# Patient Record
Sex: Male | Born: 1940 | Race: Black or African American | Hispanic: No | Marital: Single | Smoking: Current some day smoker
Health system: Southern US, Community
[De-identification: ages and names within clinical notes are randomized; demographics above are authoritative.]

## PROBLEM LIST (undated history)

## (undated) ENCOUNTER — Emergency Department (HOSPITAL_COMMUNITY): Admission: EM | Payer: Medicare Other

## (undated) DIAGNOSIS — Z720 Tobacco use: Secondary | ICD-10-CM

## (undated) DIAGNOSIS — H9190 Unspecified hearing loss, unspecified ear: Secondary | ICD-10-CM

## (undated) DIAGNOSIS — F1010 Alcohol abuse, uncomplicated: Secondary | ICD-10-CM

## (undated) HISTORY — PX: ABDOMINAL SURGERY: SHX537

---

## 1998-11-09 ENCOUNTER — Inpatient Hospital Stay (HOSPITAL_COMMUNITY): Admission: EM | Admit: 1998-11-09 | Discharge: 1998-11-15 | Payer: Self-pay | Admitting: Emergency Medicine

## 1998-11-09 ENCOUNTER — Encounter (INDEPENDENT_AMBULATORY_CARE_PROVIDER_SITE_OTHER): Payer: Self-pay

## 1998-11-09 ENCOUNTER — Encounter: Payer: Self-pay | Admitting: General Surgery

## 2000-01-30 ENCOUNTER — Emergency Department (HOSPITAL_COMMUNITY): Admission: EM | Admit: 2000-01-30 | Discharge: 2000-01-30 | Payer: Self-pay | Admitting: *Deleted

## 2004-07-28 ENCOUNTER — Emergency Department (HOSPITAL_COMMUNITY): Admission: EM | Admit: 2004-07-28 | Discharge: 2004-07-28 | Payer: Self-pay | Admitting: Emergency Medicine

## 2008-02-11 ENCOUNTER — Inpatient Hospital Stay (HOSPITAL_COMMUNITY): Admission: EM | Admit: 2008-02-11 | Discharge: 2008-02-12 | Payer: Self-pay | Admitting: Family Medicine

## 2009-01-26 ENCOUNTER — Emergency Department (HOSPITAL_COMMUNITY): Admission: EM | Admit: 2009-01-26 | Discharge: 2009-01-26 | Payer: Self-pay | Admitting: Emergency Medicine
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
## 2010-11-26 LAB — COMPREHENSIVE METABOLIC PANEL
ALT: 12 U/L (ref 0–53)
Alkaline Phosphatase: 67 U/L (ref 39–117)
BUN: 12 mg/dL (ref 6–23)
CO2: 24 mEq/L (ref 19–32)
GFR calc non Af Amer: >60 mL/min (ref >60)
Glucose, Bld: 72 mg/dL (ref 70–99)
Potassium: 4.1 mEq/L (ref 3.5–5.1)
Sodium: 136 mEq/L (ref 135–145)
Total Bilirubin: 1 mg/dL (ref 0.3–1.2)

## 2010-11-26 LAB — DIFFERENTIAL
Basophils Absolute: 0 10*3/uL (ref 0.0–0.1)
Basophils Relative: 0 % (ref 0–1)
Eosinophils Absolute: 0.1 10*3/uL (ref 0.0–0.7)
Neutrophils Relative %: 73 % (ref 43–77)

## 2010-11-26 LAB — URINALYSIS, ROUTINE W REFLEX MICROSCOPIC
Bilirubin Urine: NEGATIVE
Glucose, UA: NEGATIVE mg/dL
Ketones, ur: NEGATIVE mg/dL
Nitrite: NEGATIVE
Protein, ur: NEGATIVE mg/dL
pH: 5.5 (ref 5.0–8.0)

## 2010-11-26 LAB — BASIC METABOLIC PANEL
CO2: 28 mEq/L (ref 19–32)
Calcium: 9.1 mg/dL (ref 8.4–10.5)
Creatinine, Ser: 1.09 mg/dL (ref 0.4–1.5)
GFR calc Af Amer: >60 mL/min (ref >60)
GFR calc non Af Amer: >60 mL/min (ref >60)

## 2010-11-26 LAB — LIPASE, BLOOD: Lipase: 18 U/L (ref 11–59)

## 2010-11-26 LAB — URINE MICROSCOPIC-ADD ON

## 2010-11-26 LAB — CBC
HCT: 45.6 % (ref 39.0–52.0)
Hemoglobin: 14.7 g/dL (ref 13.0–17.0)
MCHC: 32 g/dL (ref 30.0–36.0)
RBC: 4.66 MIL/uL (ref 4.22–5.81)
RBC: 5.3 MIL/uL (ref 4.22–5.81)

## 2011-04-19 ENCOUNTER — Ambulatory Visit (INDEPENDENT_AMBULATORY_CARE_PROVIDER_SITE_OTHER): Payer: Medicare Other | Admitting: Family Medicine

## 2011-04-19 VITALS — BP 135/83 | HR 64 | Temp 97.4°F | Resp 18 | Ht 68.5 in | Wt 137.0 lb

## 2011-04-19 DIAGNOSIS — Z125 Encounter for screening for malignant neoplasm of prostate: Secondary | ICD-10-CM | POA: Diagnosis not present

## 2011-04-19 DIAGNOSIS — H9319 Tinnitus, unspecified ear: Secondary | ICD-10-CM | POA: Diagnosis not present

## 2011-04-19 DIAGNOSIS — Z0000 Encounter for general adult medical examination without abnormal findings: Secondary | ICD-10-CM

## 2011-04-19 DIAGNOSIS — N400 Enlarged prostate without lower urinary tract symptoms: Secondary | ICD-10-CM

## 2011-04-19 LAB — COMPREHENSIVE METABOLIC PANEL
ALT: 17 U/L (ref 0–53)
CO2: 21 mEq/L (ref 19–32)
Calcium: 10.3 mg/dL (ref 8.4–10.5)
Chloride: 104 mEq/L (ref 96–112)
Creat: 0.98 mg/dL (ref 0.50–1.35)
Sodium: 139 mEq/L (ref 135–145)
Total Protein: 8.5 g/dL — ABNORMAL HIGH (ref 6.0–8.3)

## 2011-04-19 NOTE — Progress Notes (Signed)
  Subjective:    Patient ID: Corey Dorsey, male    DOB: August 17, 1940, 71 y.o.   MRN: [id number]  HPI 71 yo male here for cpe.  Also complains of tinnitus for about a month or less.  Comes and goes.  Sometimes very loud.  Very irritating to patient.  No other symptoms of congestion, cough, sore throat.  No other complaints.  Generally healthy.  On no meds.    Review of Systems Negative except as per HPI     Objective:   Physical Exam  Constitutional: Vital signs are normal. He appears well-developed and well-nourished. He is active.  HENT:  Head: Normocephalic.  Right Ear: Hearing, tympanic membrane, external ear and ear canal normal.  Left Ear: Hearing, tympanic membrane, external ear and ear canal normal.  Nose: Nose normal.  Mouth/Throat: Uvula is midline, oropharynx is clear and moist and mucous membranes are normal.  Eyes: Conjunctivae and EOM are normal. Pupils are equal, round, and reactive to light.  Neck: No mass and no thyromegaly present.  Cardiovascular: Normal rate, regular rhythm, normal heart sounds, intact distal pulses and normal pulses.   Pulmonary/Chest: Effort normal and breath sounds normal.  Abdominal: Soft. Normal appearance and bowel sounds are normal. There is no hepatosplenomegaly. There is no tenderness. There is no CVA tenderness. No hernia.  Genitourinary: Testes normal and penis normal.  Lymphadenopathy:    He has no cervical adenopathy.    He has no axillary adenopathy.  Neurological: He is alert.          Assessment & Plan:  cpe - routine labs Tinnitus - flonase and pred taper.

## 2011-04-20 LAB — CBC
MCV: 88.8 fL (ref 78.0–100.0)
Platelets: 135 10*3/uL — ABNORMAL LOW (ref 150–400)
RBC: 5.27 MIL/uL (ref 4.22–5.81)
WBC: 4.5 10*3/uL (ref 4.0–10.5)

## 2011-04-20 LAB — PSA, MEDICARE: PSA: 0.62 ng/mL (ref <=4.00)

## 2011-04-28 ENCOUNTER — Other Ambulatory Visit: Payer: Self-pay | Admitting: Internal Medicine

## 2011-04-28 DIAGNOSIS — Z131 Encounter for screening for diabetes mellitus: Secondary | ICD-10-CM | POA: Diagnosis not present

## 2011-04-28 DIAGNOSIS — M899 Disorder of bone, unspecified: Secondary | ICD-10-CM | POA: Diagnosis not present

## 2011-04-28 DIAGNOSIS — M1710 Unilateral primary osteoarthritis, unspecified knee: Secondary | ICD-10-CM | POA: Diagnosis not present

## 2011-04-28 DIAGNOSIS — F23 Brief psychotic disorder: Secondary | ICD-10-CM | POA: Diagnosis not present

## 2011-04-28 DIAGNOSIS — Z23 Encounter for immunization: Secondary | ICD-10-CM | POA: Diagnosis not present

## 2011-04-28 DIAGNOSIS — N400 Enlarged prostate without lower urinary tract symptoms: Secondary | ICD-10-CM | POA: Diagnosis not present

## 2011-04-28 DIAGNOSIS — M19019 Primary osteoarthritis, unspecified shoulder: Secondary | ICD-10-CM | POA: Insufficient documentation

## 2011-04-28 DIAGNOSIS — M25519 Pain in unspecified shoulder: Secondary | ICD-10-CM | POA: Diagnosis not present

## 2011-04-28 DIAGNOSIS — Z1329 Encounter for screening for other suspected endocrine disorder: Secondary | ICD-10-CM | POA: Diagnosis not present

## 2011-04-28 DIAGNOSIS — M259 Joint disorder, unspecified: Secondary | ICD-10-CM | POA: Insufficient documentation

## 2011-04-28 DIAGNOSIS — Z13220 Encounter for screening for lipoid disorders: Secondary | ICD-10-CM | POA: Diagnosis not present

## 2011-04-28 DIAGNOSIS — K5900 Constipation, unspecified: Secondary | ICD-10-CM | POA: Diagnosis not present

## 2011-04-29 ENCOUNTER — Emergency Department (HOSPITAL_COMMUNITY)
Admission: EM | Admit: 2011-04-29 | Discharge: 2011-04-29 | Disposition: A | Discharge status: Home Health | Payer: Medicare Other | Attending: Emergency Medicine | Admitting: Emergency Medicine

## 2011-04-29 ENCOUNTER — Encounter (HOSPITAL_COMMUNITY): Payer: Self-pay | Admitting: Emergency Medicine

## 2011-04-29 ENCOUNTER — Emergency Department (HOSPITAL_COMMUNITY): Payer: Medicare Other

## 2011-04-29 DIAGNOSIS — M25519 Pain in unspecified shoulder: Secondary | ICD-10-CM | POA: Diagnosis not present

## 2011-04-29 DIAGNOSIS — M19012 Primary osteoarthritis, left shoulder: Secondary | ICD-10-CM

## 2011-04-29 DIAGNOSIS — M259 Joint disorder, unspecified: Secondary | ICD-10-CM | POA: Diagnosis not present

## 2011-04-29 DIAGNOSIS — M19019 Primary osteoarthritis, unspecified shoulder: Secondary | ICD-10-CM | POA: Diagnosis not present

## 2011-04-29 DIAGNOSIS — M25512 Pain in left shoulder: Secondary | ICD-10-CM

## 2011-04-29 MED ORDER — HYDROCODONE-ACETAMINOPHEN 5-325 MG PO TABS: 1.0000 | ORAL_TABLET | ORAL | Status: AC | PRN

## 2011-04-29 MED ORDER — HYDROCODONE-ACETAMINOPHEN 5-325 MG PO TABS: 1.0000 | ORAL_TABLET | Freq: Once | ORAL | Status: DC

## 2011-04-29 NOTE — ED Notes (Signed)
PT. REPORTS PROGRESSING LEFT SHOULDER PAIN FOR SEVERAL DAYS , DENIES INJURY OR FALL , STATES RECEIVED AN INJECTION YESTERDAY AT LEFT SHOULDER FOR PAIN WITH NO RELIEF.
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
## 2011-05-11 DIAGNOSIS — Z13220 Encounter for screening for lipoid disorders: Secondary | ICD-10-CM | POA: Diagnosis not present

## 2011-05-12 DIAGNOSIS — F23 Brief psychotic disorder: Secondary | ICD-10-CM | POA: Diagnosis not present

## 2011-05-12 DIAGNOSIS — E559 Vitamin D deficiency, unspecified: Secondary | ICD-10-CM | POA: Diagnosis not present

## 2011-05-12 DIAGNOSIS — F17200 Nicotine dependence, unspecified, uncomplicated: Secondary | ICD-10-CM | POA: Diagnosis not present

## 2011-05-12 DIAGNOSIS — H9319 Tinnitus, unspecified ear: Secondary | ICD-10-CM | POA: Diagnosis not present

## 2011-05-13 ENCOUNTER — Ambulatory Visit
Admission: RE | Admit: 2011-05-13 | Discharge: 2011-05-13 | Disposition: A | Discharge status: Home / Self Care | Payer: Medicare Other | Source: Ambulatory Visit | Attending: Internal Medicine | Admitting: Internal Medicine

## 2011-05-13 ENCOUNTER — Other Ambulatory Visit: Payer: Self-pay | Admitting: Internal Medicine

## 2011-05-13 DIAGNOSIS — F17200 Nicotine dependence, unspecified, uncomplicated: Secondary | ICD-10-CM

## 2011-05-13 DIAGNOSIS — I714 Abdominal aortic aneurysm, without rupture: Secondary | ICD-10-CM | POA: Diagnosis not present

## 2011-05-19 ENCOUNTER — Encounter (HOSPITAL_COMMUNITY): Payer: Self-pay | Admitting: Physical Medicine and Rehabilitation

## 2011-05-19 ENCOUNTER — Emergency Department (HOSPITAL_COMMUNITY): Payer: Medicare Other

## 2011-05-19 ENCOUNTER — Emergency Department (HOSPITAL_COMMUNITY)
Admission: EM | Admit: 2011-05-19 | Discharge: 2011-05-19 | Disposition: A | Discharge status: Home / Self Care | Payer: Medicare Other | Attending: Emergency Medicine | Admitting: Emergency Medicine

## 2011-05-19 DIAGNOSIS — F17200 Nicotine dependence, unspecified, uncomplicated: Secondary | ICD-10-CM | POA: Insufficient documentation

## 2011-05-19 DIAGNOSIS — S62113A Displaced fracture of triquetrum [cuneiform] bone, unspecified wrist, initial encounter for closed fracture: Secondary | ICD-10-CM | POA: Insufficient documentation

## 2011-05-19 DIAGNOSIS — M7989 Other specified soft tissue disorders: Secondary | ICD-10-CM | POA: Insufficient documentation

## 2011-05-19 DIAGNOSIS — W010XXA Fall on same level from slipping, tripping and stumbling without subsequent striking against object, initial encounter: Secondary | ICD-10-CM | POA: Insufficient documentation

## 2011-05-19 DIAGNOSIS — S63509A Unspecified sprain of unspecified wrist, initial encounter: Secondary | ICD-10-CM | POA: Insufficient documentation

## 2011-05-19 DIAGNOSIS — M25539 Pain in unspecified wrist: Secondary | ICD-10-CM | POA: Insufficient documentation

## 2011-05-19 DIAGNOSIS — S63501A Unspecified sprain of right wrist, initial encounter: Secondary | ICD-10-CM

## 2011-05-19 DIAGNOSIS — S62112A Displaced fracture of triquetrum [cuneiform] bone, left wrist, initial encounter for closed fracture: Secondary | ICD-10-CM

## 2011-05-19 MED ORDER — HYDROCODONE-ACETAMINOPHEN 5-325 MG PO TABS
2.0000 | ORAL_TABLET | Freq: Once | ORAL | Status: AC
  Administered 2011-05-19: 2 via ORAL
  Filled 2011-05-19: qty 2

## 2011-05-19 MED ORDER — HYDROCODONE-ACETAMINOPHEN 5-325 MG PO TABS: 2.0000 | ORAL_TABLET | ORAL | Status: AC | PRN

## 2011-05-19 NOTE — ED Notes (Signed)
Xray called for delay in 2nd xray

## 2011-05-19 NOTE — Progress Notes (Signed)
Orthopedic Tech Progress Note Patient Details:  ODEL SCHMID [birth year] [id number]  Other Ortho Devices Ortho Device Location: wrist splint Ortho Device Interventions: Application   Cammer, Mickie Bail 05/19/2011, 11:37 AM

## 2011-05-19 NOTE — Discharge Instructions (Signed)
Cast or Splint Care Casts and splints support injured limbs and keep bones from moving while they heal.  HOME CARE  Keep the cast or splint uncovered during the drying period.   A plaster cast can take 24 to 48 hours to dry.   A fiberglass cast will dry in less than 1 hour.   Do not rest the cast on anything harder than a pillow for 24 hours.   Do not put weight on your injured limb. Do not put pressure on the cast. Wait for your doctor's approval.   Keep the cast or splint dry.   Cover the cast or splint with a plastic bag during baths or wet weather.   If you have a cast over your chest and belly (trunk), take sponge baths until the cast is taken off.   Keep your cast or splint clean. Wash a dirty cast with a damp cloth.   Do not put any objects under your cast or splint. Do not scratch the skin under the cast with an object.   Do not take out the padding from inside your cast.   Exercise your joints near the cast as told by your doctor.   Raise (elevate) your injured limb on 1 or 2 pillows for the first 1 to 3 days.  GET HELP RIGHT AWAY IF:  Your cast or splint cracks.   Your cast or splint is too tight or too loose.   You itch badly under the cast.   Your cast gets wet or has a soft spot.   You have a bad smell coming from the cast.   You get an object stuck under the cast.   Your skin around the cast becomes red or raw.   You have new or more pain after the cast is put on.   You have fluid leaking through the cast.   You cannot move your fingers or toes.   Your fingers or toes turn colors or are cool, painful, or puffy (swollen).   You have tingling or lose feeling (numbness) around the injured area.   You have pain or pressure under the cast.   You have trouble breathing or have shortness of breath.   You have chest pain.  MAKE SURE YOU:  Understand these instructions.   Will watch your condition.   Will get help right away if you are not doing  well or get worse.  Document Released: 06/09/2010 Document Revised: 01/27/2011 Document Reviewed: 06/09/2010 [facility] Patient Information 2012 [place], [place].Wrist Fracture A wrist fracture is when one or more wrist bones are broken (fractured). A cast or splint is used to keep the injured bones from moving. The cast or splint is often on for 4 to 6 weeks. HOME CARE  Keep the wrist raised (elevated) when sitting or lying down.   Do not wear rings or jewelry on the injured hand or wrist.   Put ice on the injured area.   Put ice in a plastic bag.   Place a towel between your skin and the bag.   Leave the ice on for 15 to 20 minutes, 3 to 4 times a day.   If you are given a plaster or fiberglass cast:   Do not try to scratch the skin under the cast with sharp objects.   Check the skin around the cast every day. You may put lotion on any red or sore areas. Keep the cast dry and clean.   If you are  given a plaster splint:   Wear it as told. You may loosen the elastic around the splint if the fingers get numb, tingle, or turn a little blue.   If you are given a removable splint, wear it as told.   Do not use powders or deodorants in or around the cast or splint.   Do not remove padding from your cast or splint.   Do not put pressure on any part of the cast or splint. It may break. Rest the cast or splint on a pillow for the first 24 hours until it is fully hardened.   Gently move your fingers so they do not get stiff.   Do not remove the splint unless your doctor tells you to. Casts must be removed by a doctor.   Only take medicine as told by your doctor.   See your doctor again within 1 week to make sure you are healing well. If you have a splint, you may need a cast after the puffiness (swelling) goes down.  GET HELP RIGHT AWAY IF:   The cast or splint gets damaged or breaks.   The pain gets worse and is not helped by medicine.   There is more puffiness than there was  before the cast was put on.   The skin or nails turn blue or gray or feel cold or numb.   The cast or splint feels too tight or too loose.   You have trouble moving or feeling your fingers.   You have a burning or stinging feeling from the cast or splint.   There is a bad smell coming from the cast or splint.   New stains or fluids are coming from under the cast or splint.   You have any new injuries while wearing the cast or splint.  MAKE SURE YOU:   Understand these instructions.   Will watch your condition.   Will get help right away if you are not doing well or get worse.  Document Released: 07/27/2007 Document Revised: 01/27/2011 Document Reviewed: 07/08/2009 [facility] Patient Information 2012 [place], [place].

## 2011-05-19 NOTE — ED Provider Notes (Signed)
History     CSN: [id number]  Arrival date & time 05/19/11  5621   First MD Initiated Contact with Patient 05/19/11 [phone]      Chief Complaint  Patient presents with  . Fall  . Joint Swelling    (Consider location/radiation/quality/duration/timing/severity/associated sxs/prior treatment) Patient is a 71 y.o. male presenting with hand pain. The history is provided by the patient. No language interpreter was used.  Hand Pain This is a new problem. The current episode started yesterday. The problem occurs constantly. The problem has been unchanged. Associated symptoms include joint swelling. The symptoms are aggravated by nothing. He has tried nothing for the symptoms. The treatment provided no relief.  Pt complains of falling yesterday.  Pt reports he has a small cut on his lower lip.  Pt also has swelling and pain in both wrist.   History reviewed. No pertinent past medical history.  History reviewed. No pertinent past surgical history.  History reviewed. No pertinent family history.  History  Substance Use Topics  . Smoking status: Current Everyday Smoker -- 4.0 packs/day    Types: Cigarettes  . Smokeless tobacco: Not on file  . Alcohol Use: Yes      Review of Systems  Musculoskeletal: Positive for joint swelling.  All other systems reviewed and are negative.    Allergies  Review of patient's allergies indicates no known allergies.  Home Medications   Current Outpatient Rx  Name Route Sig Dispense Refill  . VITAMIN D PO Oral Take 1 tablet by mouth daily.    Marland Kitchen HYDROCODONE-ACETAMINOPHEN 5-325 MG PO TABS Oral Take 1 tablet by mouth every 6 (six) hours as needed. For pain    . RISPERIDONE 0.5 MG PO TABS Oral Take 0.5 mg by mouth at bedtime.       BP 106/89  Pulse 101  Temp(Src) 98 F (36.7 C) (Oral)  Resp 20  SpO2 96%  Physical Exam  Nursing note and vitals reviewed. Constitutional: He is oriented to person, place, and time. He appears well-developed and  well-nourished.  HENT:  Head: Normocephalic and atraumatic.  Eyes: Pupils are equal, round, and reactive to light.  Neck: Normal range of motion. Neck supple.  Musculoskeletal: He exhibits tenderness.       Swollen right and left wrist,  Decreased range of motion,  nv and ns intact  Neurological: He is alert and oriented to person, place, and time. He has normal reflexes.  Skin: Skin is warm and dry.  Psychiatric: He has a normal mood and affect.    ED Course  Procedures (including critical care time)  Labs Reviewed - No data to display Dg Hand Complete Left  05/19/2011  *RADIOLOGY REPORT*  Clinical Data: Status post fall.  Swelling, redness and pain.  LEFT HAND - COMPLETE 3+ VIEW  Comparison: None.  Findings: There is soft tissue swelling over the dorsum of the wrist.  A triquetral fracture is seen on the lateral view.  No other acute abnormality is identified.  There also appears to be an old fracture of the proximal phalanx of the little finger.  No dislocation or foreign body.  IMPRESSION: Acute triquetral fracture with associated soft tissue swelling.  Original Report Authenticated By: Bernadene Bell. Maricela Curet, M.D.     No diagnosis found.    MDM  Pt placed in splints,  Referred to recheck with Dr. Lorelle Formosa, [place] 05/19/11 1106

## 2011-05-19 NOTE — Progress Notes (Signed)
Orthopedic Tech Progress Note Patient Details:  Corey Dorsey 05/19/1940 [id number]  Type of Splint: Volar Splint Location: arm sling Splint Interventions: Application    Cammer, Mickie Bail 05/19/2011, 11:36 AM

## 2011-05-19 NOTE — ED Notes (Signed)
Pt presents to department for evaluation of fall. States she tripped off curb on Tuesday night. Now states L hand swelling, redness and pain. Able to wiggle digits. Capillary refill less than 2 seconds. He is alert and oriented x4. No obvious deformities noted.

## 2011-05-19 NOTE — ED Notes (Signed)
Ortho paged. 

## 2011-05-19 NOTE — ED Provider Notes (Signed)
Medical screening examination/treatment/procedure(s) were performed by non-physician practitioner and as supervising physician I was immediately available for consultation/collaboration.   Glynn Octave, MD 05/19/11 1133

## 2011-05-25 DIAGNOSIS — S62109A Fracture of unspecified carpal bone, unspecified wrist, initial encounter for closed fracture: Secondary | ICD-10-CM | POA: Diagnosis not present

## 2011-05-26 DIAGNOSIS — F0390 Unspecified dementia without behavioral disturbance: Secondary | ICD-10-CM | POA: Diagnosis not present

## 2011-06-09 ENCOUNTER — Other Ambulatory Visit (HOSPITAL_COMMUNITY): Payer: Self-pay | Admitting: Psychiatry

## 2011-06-09 DIAGNOSIS — G939 Disorder of brain, unspecified: Secondary | ICD-10-CM

## 2011-06-13 ENCOUNTER — Ambulatory Visit (HOSPITAL_COMMUNITY)
Admission: RE | Admit: 2011-06-13 | Discharge: 2011-06-13 | Disposition: A | Discharge status: Home / Self Care | Payer: Medicare Other | Source: Ambulatory Visit | Attending: Psychiatry | Admitting: Psychiatry

## 2011-06-13 DIAGNOSIS — W19XXXA Unspecified fall, initial encounter: Secondary | ICD-10-CM | POA: Insufficient documentation

## 2011-06-13 DIAGNOSIS — G939 Disorder of brain, unspecified: Secondary | ICD-10-CM

## 2011-06-13 DIAGNOSIS — S0990XA Unspecified injury of head, initial encounter: Secondary | ICD-10-CM | POA: Diagnosis not present

## 2011-06-13 DIAGNOSIS — G319 Degenerative disease of nervous system, unspecified: Secondary | ICD-10-CM | POA: Diagnosis not present

## 2011-06-15 DIAGNOSIS — E559 Vitamin D deficiency, unspecified: Secondary | ICD-10-CM | POA: Diagnosis not present

## 2011-06-15 DIAGNOSIS — F17200 Nicotine dependence, unspecified, uncomplicated: Secondary | ICD-10-CM | POA: Diagnosis not present

## 2011-06-15 DIAGNOSIS — E785 Hyperlipidemia, unspecified: Secondary | ICD-10-CM | POA: Diagnosis not present

## 2011-06-15 DIAGNOSIS — M949 Disorder of cartilage, unspecified: Secondary | ICD-10-CM | POA: Diagnosis not present

## 2011-06-30 DIAGNOSIS — F0390 Unspecified dementia without behavioral disturbance: Secondary | ICD-10-CM | POA: Diagnosis not present

## 2011-06-30 DIAGNOSIS — F22 Delusional disorders: Secondary | ICD-10-CM | POA: Diagnosis not present

## 2011-08-15 DIAGNOSIS — F22 Delusional disorders: Secondary | ICD-10-CM | POA: Diagnosis not present

## 2011-08-15 DIAGNOSIS — F0390 Unspecified dementia without behavioral disturbance: Secondary | ICD-10-CM | POA: Diagnosis not present

## 2011-09-29 DIAGNOSIS — H9319 Tinnitus, unspecified ear: Secondary | ICD-10-CM | POA: Diagnosis not present

## 2011-09-29 DIAGNOSIS — H9110 Presbycusis, unspecified ear: Secondary | ICD-10-CM | POA: Diagnosis not present

## 2011-11-03 DIAGNOSIS — Z23 Encounter for immunization: Secondary | ICD-10-CM | POA: Diagnosis not present

## 2011-11-11 DIAGNOSIS — H9110 Presbycusis, unspecified ear: Secondary | ICD-10-CM | POA: Diagnosis not present

## 2011-11-11 DIAGNOSIS — H9319 Tinnitus, unspecified ear: Secondary | ICD-10-CM | POA: Diagnosis not present

## 2011-11-11 DIAGNOSIS — F17200 Nicotine dependence, unspecified, uncomplicated: Secondary | ICD-10-CM | POA: Diagnosis not present

## 2011-11-14 ENCOUNTER — Other Ambulatory Visit: Payer: Self-pay | Admitting: Otolaryngology

## 2011-11-14 DIAGNOSIS — H9319 Tinnitus, unspecified ear: Secondary | ICD-10-CM

## 2011-11-17 ENCOUNTER — Ambulatory Visit
Admission: RE | Admit: 2011-11-17 | Discharge: 2011-11-17 | Disposition: A | Discharge status: Home / Self Care | Payer: Medicare Other | Source: Ambulatory Visit | Attending: Otolaryngology | Admitting: Otolaryngology

## 2011-11-17 DIAGNOSIS — H9319 Tinnitus, unspecified ear: Secondary | ICD-10-CM | POA: Diagnosis not present

## 2011-11-17 MED ORDER — GADOBENATE DIMEGLUMINE 529 MG/ML IV SOLN
10.0000 mL | Freq: Once | INTRAVENOUS | Status: AC | PRN
  Administered 2011-11-17: 10 mL via INTRAVENOUS

## 2012-02-27 DIAGNOSIS — F22 Delusional disorders: Secondary | ICD-10-CM | POA: Diagnosis not present

## 2012-02-27 DIAGNOSIS — F0390 Unspecified dementia without behavioral disturbance: Secondary | ICD-10-CM | POA: Diagnosis not present

## 2012-03-30 DIAGNOSIS — F22 Delusional disorders: Secondary | ICD-10-CM | POA: Diagnosis not present

## 2012-03-30 DIAGNOSIS — F0390 Unspecified dementia without behavioral disturbance: Secondary | ICD-10-CM | POA: Diagnosis not present

## 2012-06-19 ENCOUNTER — Encounter (HOSPITAL_COMMUNITY): Payer: Self-pay

## 2012-06-19 ENCOUNTER — Emergency Department (INDEPENDENT_AMBULATORY_CARE_PROVIDER_SITE_OTHER)
Admission: EM | Admit: 2012-06-19 | Discharge: 2012-06-19 | Disposition: A | Discharge status: Home / Self Care | Payer: Medicare Other | Source: Home / Self Care

## 2012-06-19 DIAGNOSIS — Z136 Encounter for screening for cardiovascular disorders: Secondary | ICD-10-CM | POA: Diagnosis not present

## 2012-06-19 DIAGNOSIS — H9319 Tinnitus, unspecified ear: Secondary | ICD-10-CM

## 2012-06-19 DIAGNOSIS — F17200 Nicotine dependence, unspecified, uncomplicated: Secondary | ICD-10-CM | POA: Diagnosis not present

## 2012-06-19 DIAGNOSIS — R634 Abnormal weight loss: Secondary | ICD-10-CM | POA: Diagnosis not present

## 2012-06-19 DIAGNOSIS — H9313 Tinnitus, bilateral: Secondary | ICD-10-CM

## 2012-06-19 LAB — COMPREHENSIVE METABOLIC PANEL
ALT: 11 U/L (ref 0–53)
AST: 19 U/L (ref 0–37)
Albumin: 3.5 g/dL (ref 3.5–5.2)
CO2: 28 mEq/L (ref 19–32)
Calcium: 10.1 mg/dL (ref 8.4–10.5)
Creatinine, Ser: 1.07 mg/dL (ref 0.50–1.35)
GFR calc non Af Amer: 67 mL/min — ABNORMAL LOW (ref >90)
Sodium: 139 mEq/L (ref 135–145)
Total Protein: 7.9 g/dL (ref 6.0–8.3)

## 2012-06-19 LAB — LIPID PANEL
HDL: 48 mg/dL (ref >39)
LDL Cholesterol: 112 mg/dL — ABNORMAL HIGH (ref 0–99)
Total CHOL/HDL Ratio: 3.6 RATIO
Triglycerides: 63 mg/dL (ref <150)
VLDL: 13 mg/dL (ref 0–40)

## 2012-06-19 LAB — CBC
MCH: 28.9 pg (ref 26.0–34.0)
Platelets: 159 10*3/uL (ref 150–400)
RBC: 4.92 MIL/uL (ref 4.22–5.81)
RDW: 13.2 % (ref 11.5–15.5)

## 2012-06-19 LAB — TSH: TSH: 1.771 u[IU]/mL (ref 0.350–4.500)

## 2012-06-19 NOTE — ED Notes (Signed)
Patient states here to establish care Hears noises in his head for a while Has had a scan of his brain at [facility]
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
## 2012-06-21 NOTE — ED Notes (Signed)
Patient has an app 07/20/12 [facility] @ 1pm

## 2012-06-21 NOTE — ED Notes (Signed)
Patient has an appt  [facility] 07/09/12 at 2:10pm

## 2012-07-09 DIAGNOSIS — R443 Hallucinations, unspecified: Secondary | ICD-10-CM | POA: Diagnosis not present

## 2012-07-30 DIAGNOSIS — Z125 Encounter for screening for malignant neoplasm of prostate: Secondary | ICD-10-CM | POA: Diagnosis not present

## 2012-07-30 DIAGNOSIS — R030 Elevated blood-pressure reading, without diagnosis of hypertension: Secondary | ICD-10-CM | POA: Diagnosis not present

## 2012-07-30 DIAGNOSIS — F17200 Nicotine dependence, unspecified, uncomplicated: Secondary | ICD-10-CM | POA: Diagnosis not present

## 2012-07-30 DIAGNOSIS — Z0000 Encounter for general adult medical examination without abnormal findings: Secondary | ICD-10-CM | POA: Diagnosis not present

## 2012-07-30 DIAGNOSIS — H9319 Tinnitus, unspecified ear: Secondary | ICD-10-CM | POA: Diagnosis not present

## 2012-07-30 DIAGNOSIS — H9209 Otalgia, unspecified ear: Secondary | ICD-10-CM | POA: Diagnosis not present

## 2012-07-30 DIAGNOSIS — E785 Hyperlipidemia, unspecified: Secondary | ICD-10-CM | POA: Diagnosis not present

## 2012-07-30 DIAGNOSIS — H938X9 Other specified disorders of ear, unspecified ear: Secondary | ICD-10-CM | POA: Diagnosis not present

## 2012-08-13 DIAGNOSIS — Z79899 Other long term (current) drug therapy: Secondary | ICD-10-CM | POA: Diagnosis not present

## 2012-08-13 DIAGNOSIS — R0989 Other specified symptoms and signs involving the circulatory and respiratory systems: Secondary | ICD-10-CM | POA: Diagnosis not present

## 2012-08-13 DIAGNOSIS — F17200 Nicotine dependence, unspecified, uncomplicated: Secondary | ICD-10-CM | POA: Diagnosis not present

## 2012-08-13 DIAGNOSIS — R0609 Other forms of dyspnea: Secondary | ICD-10-CM | POA: Diagnosis not present

## 2012-08-13 DIAGNOSIS — R443 Hallucinations, unspecified: Secondary | ICD-10-CM | POA: Diagnosis not present

## 2012-08-13 DIAGNOSIS — E785 Hyperlipidemia, unspecified: Secondary | ICD-10-CM | POA: Diagnosis not present

## 2012-09-05 ENCOUNTER — Emergency Department (HOSPITAL_COMMUNITY)
Admission: EM | Admit: 2012-09-05 | Discharge: 2012-09-05 | Disposition: A | Discharge status: Home / Self Care | Payer: Medicare Other | Attending: Emergency Medicine | Admitting: Emergency Medicine

## 2012-09-05 ENCOUNTER — Encounter (HOSPITAL_COMMUNITY): Payer: Self-pay | Admitting: *Deleted

## 2012-09-05 DIAGNOSIS — F17200 Nicotine dependence, unspecified, uncomplicated: Secondary | ICD-10-CM | POA: Diagnosis not present

## 2012-09-05 DIAGNOSIS — M79609 Pain in unspecified limb: Secondary | ICD-10-CM | POA: Diagnosis not present

## 2012-09-05 DIAGNOSIS — M79651 Pain in right thigh: Secondary | ICD-10-CM

## 2012-09-05 DIAGNOSIS — H9190 Unspecified hearing loss, unspecified ear: Secondary | ICD-10-CM | POA: Insufficient documentation

## 2012-09-05 DIAGNOSIS — Z8669 Personal history of other diseases of the nervous system and sense organs: Secondary | ICD-10-CM | POA: Diagnosis not present

## 2012-09-05 DIAGNOSIS — M25559 Pain in unspecified hip: Secondary | ICD-10-CM | POA: Diagnosis not present

## 2012-09-05 HISTORY — DX: Unspecified hearing loss, unspecified ear: H91.90

## 2012-09-05 LAB — POCT I-STAT, CHEM 8
BUN: 13 mg/dL (ref 6–23)
Calcium, Ion: 1.2 mmol/L (ref 1.13–1.30)
Creatinine, Ser: 1.2 mg/dL (ref 0.50–1.35)
Glucose, Bld: 69 mg/dL — ABNORMAL LOW (ref 70–99)
Hemoglobin: 15.6 g/dL (ref 13.0–17.0)
TCO2: 23 mmol/L (ref 0–100)

## 2012-09-05 MED ORDER — KETOROLAC TROMETHAMINE 60 MG/2ML IM SOLN
60.0000 mg | Freq: Once | INTRAMUSCULAR | Status: AC
  Administered 2012-09-05: 60 mg via INTRAMUSCULAR
  Filled 2012-09-05: qty 2

## 2012-09-05 MED ORDER — NAPROXEN 500 MG PO TABS: 500.0000 mg | ORAL_TABLET | Freq: Two times a day (BID) | ORAL | Status: DC

## 2012-09-05 NOTE — ED Provider Notes (Signed)
History    CSN: [id number] Arrival date & time 09/05/12  1830  First MD Initiated Contact with Patient 09/05/12 2003     Chief Complaint  Patient presents with  . Leg Pain   (Consider location/radiation/quality/duration/timing/severity/associated sxs/prior Treatment) HPI Comments: 72 year old male with no significant past medical history, presents with a complaint of right leg pain. He states that this pain started last night or early this morning, he cannot tell me exactly when it happened. He states that this pain is intermittent in intensity however he feels like the pain is fairly persistent over time. It definitely gets worse when he walks, when he tries to lift his leg but has not been associated with any swelling or injuries. He states that he went fishing yesterday and thinks that he may have pulled a muscle but is unsure. He denies fevers, chills, cough, shortness of breath, swelling of the legs, rashes, history of cancer, history of travel, history of immobilization or history of exogenous hormone use. He denies numbness or weakness of the lower extremity. The pain is located in the anterior thigh between the hip and the knee and has a radiating in nature.  Patient is a 72 y.o. male presenting with leg pain. The history is provided by the patient.  Leg Pain  Past Medical History  Diagnosis Date  . Hearing loss    History reviewed. No pertinent past surgical history. No family history on file. History  Substance Use Topics  . Smoking status: Current Every Day Smoker -- 4.00 packs/day    Types: Cigarettes  . Smokeless tobacco: Never Used  . Alcohol Use: Yes    Review of Systems  All other systems reviewed and are negative.    Allergies  Review of patient's allergies indicates no known allergies.  Home Medications   Current Outpatient Rx  Name  Route  Sig  Dispense  Refill  . acetaminophen (TYLENOL) 500 MG tablet   Oral   Take 1,000 mg by mouth once.          . Doxylamine Succinate, Sleep, (SLEEP AID PO)   Oral   Take 5 mLs by mouth at bedtime as needed (insomnia).         . naproxen (NAPROSYN) 500 MG tablet   Oral   Take 1 tablet (500 mg total) by mouth 2 (two) times daily with a meal.   30 tablet   0    BP 138/83  Pulse 88  Temp(Src) 98.1 F (36.7 C) (Oral)  Resp 18  SpO2 97% Physical Exam  Nursing note and vitals reviewed. Constitutional: He appears well-developed and well-nourished. No distress.  HENT:  Head: Normocephalic and atraumatic.  Mouth/Throat: Oropharynx is clear and moist. No oropharyngeal exudate.  Eyes: Conjunctivae and EOM are normal. Pupils are equal, round, and reactive to light. Right eye exhibits no discharge. Left eye exhibits no discharge. No scleral icterus.  Neck: Normal range of motion. Neck supple. No JVD present. No thyromegaly present.  Cardiovascular: Normal rate, regular rhythm, normal heart sounds and intact distal pulses.  Exam reveals no gallop and no friction rub.   No murmur heard. Pulmonary/Chest: Effort normal and breath sounds normal. No respiratory distress. He has no wheezes. He has no rales.  Abdominal: Soft. Bowel sounds are normal. He exhibits no distension and no mass. There is no tenderness.  Musculoskeletal: Normal range of motion. He exhibits tenderness ( Reproducible tenderness in the anterior proximal thigh with straight leg raise. He is able to do  this but has apparent muscular spasm after lifting his leg for approximately one second. ). He exhibits no edema.  No pain with passive range of motion of the hip knee or ankle on the right or left legs. No peripheral edema, normal strength bilaterally at the major muscle groups  Lymphadenopathy:    He has no cervical adenopathy.  Neurological: He is alert. Coordination normal.  Normal sensation to light touch and pinprick of the bilateral lower extremities, normal coordination of all 4 extremities. Normal speech, normal gait  Skin: Skin  is warm and dry. No rash noted. No erythema.  Psychiatric: He has a normal mood and affect. His behavior is normal.    ED Course  Procedures (including critical care time) Labs Reviewed  POCT I-STAT, CHEM 8 - Abnormal; Notable for the following:    Glucose, Bld 69 (*)    All other components within normal limits   No results found. 1. Right thigh pain     MDM  The patient has normal vital signs, he is an exam consistent with a muscle strain or spasm, there does not appear to be any risk for DVT, the patient has been ordered Toradol and will be prescribed Naprosyn as an outpatient and given family doctor referrals as he states that he does not have a family doctor at this time.  The patient appears well, he has no tenderness or changes of the skin to suggest a deep soft tissue or necrotizing infection, he has normal vital signs, he has been given anti-inflammatories and has been encouraged to followup. No DVT, doubt infection, likely muscle spasm or strain.  Vida Roller, MD 09/05/12 2040

## 2012-09-05 NOTE — ED Notes (Signed)
Pt reports sudden onset of RT leg pain . Pain sharp and shoots down leg. Pulses strong pedal.

## 2012-09-07 DIAGNOSIS — F17200 Nicotine dependence, unspecified, uncomplicated: Secondary | ICD-10-CM | POA: Diagnosis not present

## 2012-09-07 DIAGNOSIS — R0989 Other specified symptoms and signs involving the circulatory and respiratory systems: Secondary | ICD-10-CM | POA: Diagnosis not present

## 2012-09-07 DIAGNOSIS — R0609 Other forms of dyspnea: Secondary | ICD-10-CM | POA: Diagnosis not present

## 2012-09-07 DIAGNOSIS — E785 Hyperlipidemia, unspecified: Secondary | ICD-10-CM | POA: Diagnosis not present

## 2012-09-07 DIAGNOSIS — R443 Hallucinations, unspecified: Secondary | ICD-10-CM | POA: Diagnosis not present

## 2012-09-24 DIAGNOSIS — R443 Hallucinations, unspecified: Secondary | ICD-10-CM | POA: Diagnosis not present

## 2012-10-12 DIAGNOSIS — F17200 Nicotine dependence, unspecified, uncomplicated: Secondary | ICD-10-CM | POA: Diagnosis not present

## 2012-10-12 DIAGNOSIS — R443 Hallucinations, unspecified: Secondary | ICD-10-CM | POA: Diagnosis not present

## 2012-10-12 DIAGNOSIS — E785 Hyperlipidemia, unspecified: Secondary | ICD-10-CM | POA: Diagnosis not present

## 2012-10-12 DIAGNOSIS — R0609 Other forms of dyspnea: Secondary | ICD-10-CM | POA: Diagnosis not present

## 2012-11-02 ENCOUNTER — Encounter: Payer: Self-pay | Admitting: Radiology

## 2012-11-02 DIAGNOSIS — H9193 Unspecified hearing loss, bilateral: Secondary | ICD-10-CM

## 2012-11-07 DIAGNOSIS — R443 Hallucinations, unspecified: Secondary | ICD-10-CM | POA: Diagnosis not present

## 2012-12-03 DIAGNOSIS — Z23 Encounter for immunization: Secondary | ICD-10-CM | POA: Diagnosis not present

## 2013-08-21 DIAGNOSIS — R443 Hallucinations, unspecified: Secondary | ICD-10-CM | POA: Diagnosis not present

## 2013-12-20 DIAGNOSIS — Z23 Encounter for immunization: Secondary | ICD-10-CM | POA: Diagnosis not present

## 2015-03-05 ENCOUNTER — Ambulatory Visit: Payer: Medicare Other | Admitting: Family

## 2015-03-31 ENCOUNTER — Encounter: Payer: Self-pay | Admitting: Family

## 2015-03-31 ENCOUNTER — Ambulatory Visit (INDEPENDENT_AMBULATORY_CARE_PROVIDER_SITE_OTHER): Payer: Medicare Other | Admitting: Family

## 2015-03-31 VITALS — BP 120/78 | HR 70 | Temp 97.7°F | Resp 14 | Ht 68.5 in | Wt 122.4 lb

## 2015-03-31 DIAGNOSIS — R972 Elevated prostate specific antigen [PSA]: Secondary | ICD-10-CM

## 2015-03-31 DIAGNOSIS — E785 Hyperlipidemia, unspecified: Secondary | ICD-10-CM | POA: Diagnosis not present

## 2015-03-31 DIAGNOSIS — Z0000 Encounter for general adult medical examination without abnormal findings: Secondary | ICD-10-CM

## 2015-03-31 DIAGNOSIS — H9191 Unspecified hearing loss, right ear: Secondary | ICD-10-CM

## 2015-03-31 DIAGNOSIS — Z23 Encounter for immunization: Secondary | ICD-10-CM | POA: Diagnosis not present

## 2015-03-31 DIAGNOSIS — F17200 Nicotine dependence, unspecified, uncomplicated: Secondary | ICD-10-CM | POA: Diagnosis not present

## 2015-03-31 NOTE — Patient Instructions (Signed)
Thank you for choosing [facility].  Summary/Instructions:  Please stop by the lab on the basement level of the building for your blood work. Your results will be released to [place] (or called to you) after review, usually within 72 hours after test completion. If any changes need to be made, you will be notified at that same time.  They will call to schedule your appointment with audiology.  Health Maintenance  Topic Date Due  . ZOSTAVAX  03/07/1999  . PNA vac Low Risk Adult (1 of 2 - PCV13) 03/06/2004  . INFLUENZA VACCINE  11/06/2015 (Originally 09/22/2014)  . TETANUS/TDAP  11/06/2015 (Originally 03/06/1959)   Health Maintenance, Male A healthy lifestyle and preventative care can promote health and wellness.  Maintain regular health, dental, and eye exams.  Eat a healthy diet. Foods like vegetables, fruits, whole grains, low-fat dairy products, and lean protein foods contain the nutrients you need and are low in calories. Decrease your intake of foods high in solid fats, added sugars, and salt. Get information about a proper diet from your health care provider, if necessary.  Regular physical exercise is one of the most important things you can do for your health. Most adults should get at least 150 minutes of moderate-intensity exercise (any activity that increases your heart rate and causes you to sweat) each week. In addition, most adults need muscle-strengthening exercises on 2 or more days a week.   Maintain a healthy weight. The body mass index (BMI) is a screening tool to identify possible weight problems. It provides an estimate of body fat based on height and weight. Your health care provider can find your BMI and can help you achieve or maintain a healthy weight. For males 20 years and older:  A BMI below 18.5 is considered underweight.  A BMI of 18.5 to 24.9 is normal.  A BMI of 25 to 29.9 is considered overweight.  A BMI of 30 and above is considered  obese.  Maintain normal blood lipids and cholesterol by exercising and minimizing your intake of saturated fat. Eat a balanced diet with plenty of fruits and vegetables. Blood tests for lipids and cholesterol should begin at age 76 and be repeated every 5 years. If your lipid or cholesterol levels are high, you are over age 81, or you are at high risk for heart disease, you may need your cholesterol levels checked more frequently.Ongoing high lipid and cholesterol levels should be treated with medicines if diet and exercise are not working.  If you smoke, find out from your health care provider how to quit. If you do not use tobacco, do not start.  Lung cancer screening is recommended for adults aged 35-80 years who are at high risk for developing lung cancer because of a history of smoking. A yearly low-dose CT scan of the lungs is recommended for people who have at least a 30-pack-year history of smoking and are current smokers or have quit within the past 15 years. A pack year of smoking is smoking an average of 1 pack of cigarettes a day for 1 year (for example, a 30-pack-year history of smoking could mean smoking 1 pack a day for 30 years or 2 packs a day for 15 years). Yearly screening should continue until the smoker has stopped smoking for at least 15 years. Yearly screening should be stopped for people who develop a health problem that would prevent them from having lung cancer treatment.  If you choose to drink alcohol, do not  have more than 2 drinks per day. One drink is considered to be 12 oz (360 mL) of beer, 5 oz (150 mL) of wine, or 1.5 oz (45 mL) of liquor.  Avoid the use of street drugs. Do not share needles with anyone. Ask for help if you need support or instructions about stopping the use of drugs.  High blood pressure causes heart disease and increases the risk of stroke. High blood pressure is more likely to develop in:  People who have blood pressure in the end of the normal  range (100-139/85-89 mm Hg).  People who are overweight or obese.  People who are African American.  If you are 27-63 years of age, have your blood pressure checked every 3-5 years. If you are 9 years of age or older, have your blood pressure checked every year. You should have your blood pressure measured twice--once when you are at a hospital or clinic, and once when you are not at a hospital or clinic. Record the average of the two measurements. To check your blood pressure when you are not at a hospital or clinic, you can use:  An automated blood pressure machine at a pharmacy.  A home blood pressure monitor.  If you are 74-12 years old, ask your health care provider if you should take aspirin to prevent heart disease.  Diabetes screening involves taking a blood sample to check your fasting blood sugar level. This should be done once every 3 years after age 82 if you are at a normal weight and without risk factors for diabetes. Testing should be considered at a younger age or be carried out more frequently if you are overweight and have at least 1 risk factor for diabetes.  Colorectal cancer can be detected and often prevented. Most routine colorectal cancer screening begins at the age of 66 and continues through age 5. However, your health care provider may recommend screening at an earlier age if you have risk factors for colon cancer. On a yearly basis, your health care provider may provide home test kits to check for hidden blood in the stool. A small camera at the end of a tube may be used to directly examine the colon (sigmoidoscopy or colonoscopy) to detect the earliest forms of colorectal cancer. Talk to your health care provider about this at age 4 when routine screening begins. A direct exam of the colon should be repeated every 5-10 years through age 37, unless early forms of precancerous polyps or small growths are found.  People who are at an increased risk for hepatitis B  should be screened for this virus. You are considered at high risk for hepatitis B if:  You were born in a country where hepatitis B occurs often. Talk with your health care provider about which countries are considered high risk.  Your parents were born in a high-risk country and you have not received a shot to protect against hepatitis B (hepatitis B vaccine).  You have HIV or AIDS.  You use needles to inject street drugs.  You live with, or have sex with, someone who has hepatitis B.  You are a man who has sex with other men (MSM).  You get hemodialysis treatment.  You take certain medicines for conditions like cancer, organ transplantation, and autoimmune conditions.  Hepatitis C blood testing is recommended for all people born from [birth year] through 1966 and any individual with known risk factors for hepatitis C.  Healthy men should no longer receive prostate-specific  antigen (PSA) blood tests as part of routine cancer screening. Talk to your health care provider about prostate cancer screening.  Testicular cancer screening is not recommended for adolescents or adult males who have no symptoms. Screening includes self-exam, a health care provider exam, and other screening tests. Consult with your health care provider about any symptoms you have or any concerns you have about testicular cancer.  Practice safe sex. Use condoms and avoid high-risk sexual practices to reduce the spread of sexually transmitted infections (STIs).  You should be screened for STIs, including gonorrhea and chlamydia if:  You are sexually active and are younger than 24 years.  You are older than 24 years, and your health care provider tells you that you are at risk for this type of infection.  Your sexual activity has changed since you were last screened, and you are at an increased risk for chlamydia or gonorrhea. Ask your health care provider if you are at risk.  If you are at risk of being infected with  HIV, it is recommended that you take a prescription medicine daily to prevent HIV infection. This is called pre-exposure prophylaxis (PrEP). You are considered at risk if:  You are a man who has sex with other men (MSM).  You are a heterosexual man who is sexually active with multiple partners.  You take drugs by injection.  You are sexually active with a partner who has HIV.  Talk with your health care provider about whether you are at high risk of being infected with HIV. If you choose to begin PrEP, you should first be tested for HIV. You should then be tested every 3 months for as long as you are taking PrEP.  Use sunscreen. Apply sunscreen liberally and repeatedly throughout the day. You should seek shade when your shadow is shorter than you. Protect yourself by wearing long sleeves, pants, a wide-brimmed hat, and sunglasses year round whenever you are outdoors.  Tell your health care provider of new moles or changes in moles, especially if there is a change in shape or color. Also, tell your health care provider if a mole is larger than the size of a pencil eraser.  A one-time screening for abdominal aortic aneurysm (AAA) and surgical repair of large AAAs by ultrasound is recommended for men aged 70-75 years who are current or former smokers.  Stay current with your vaccines (immunizations).   This information is not intended to replace advice given to you by your health care provider. Make sure you discuss any questions you have with your health care provider.   Document Released: 08/06/2007 Document Revised: 02/28/2014 Document Reviewed: 07/05/2010 Elsevier Interactive Patient Education [employer].

## 2015-03-31 NOTE — Progress Notes (Signed)
Pre visit review using our clinic review tool, if applicable. No additional management support is needed unless otherwise documented below in the visit note. 

## 2015-03-31 NOTE — Progress Notes (Signed)
Subjective:    Patient ID: Corey Dorsey, male    DOB: [birth year], 75 y.o.   MRN: [id number]  Chief Complaint  Patient presents with  . Establish Care    CPE, not fasting    HPI:  Corey Dorsey is a 75 y.o. male who presents today for an annual wellness visit.   1) Health Maintenance -   Diet - Frequent small meals and snacks throughout the day. 1-2 cups of caffeine per week.   Exercise - walking every other day  2) Preventative Exams / Immunizations:  Dental -- Due for exam  Vision -- Due for exam    Health Maintenance  Topic Date Due  . ZOSTAVAX  03/07/1999  . PNA vac Low Risk Adult (1 of 2 - PCV13) 03/06/2004  . INFLUENZA VACCINE  11/06/2015 (Originally 09/22/2014)  . TETANUS/TDAP  11/06/2015 (Originally 03/06/1958)  Prevnar; Declines flu shot, tetanus and zostavax   There is no immunization history on file for this patient.   RISK FACTORS  Tobacco History  Smoking status  . Current Some Day Smoker -- 0.15 packs/day for 58 years  . Types: Cigarettes  Smokeless tobacco  . Never Used     Cardiac risk factors: advanced age (older than 77 for men, 22 for women), male gender and smoking/ tobacco exposure.  Depression Screen  Q1: Over the past two weeks, have you felt down, depressed or hopeless? No  Q2: Over the past two weeks, have you felt little interest or pleasure in doing things? No  Have you lost interest or pleasure in daily life? No  Do you often feel hopeless? No  Do you cry easily over simple problems? No  Activities of Daily Living In your present state of health, do you have any difficulty performing the following activities?:  Driving? No Managing money?  No Feeding yourself? No Getting from bed to chair? No Climbing a flight of stairs? No Preparing food and eating?: No Bathing or showering? No Getting dressed: No Getting to the toilet? No Using the toilet: No Moving around from place to place: No In the past year have you  fallen or had a near fall?:No   Home Safety Has smoke detector and wears seat belts. No firearms. No excess sun exposure. Are there smokers in your home (other than you)?  No Do you feel safe at home?  Yes  Hearing Difficulties: No Do you often ask people to speak up or repeat themselves? Yes Do you experience ringing or noises in your ears? Occasional  Do you have difficulty understanding soft or whispered voices? Yes    Cognitive Testing  Alert? Yes   Normal Appearance? Yes  Oriented to person? Yes  Place? Yes   Time? Yes  Recall of three objects?  Yes  Can perform simple calculations? Yes  Displays appropriate judgment? Yes  Can read the correct time from a watch face? Yes  Do you feel that you have a problem with memory? No  Do you often misplace items? No   Advanced Directives have been discussed with the patient? Power of Research officer, political party and Suppliers  1. Terri Piedra, FNP - Internal Medicine  Indicate any recent Medical Services you may have received from other than [facility] providers in the past year (date may be approximate).  All answers were reviewed with the patient and necessary referrals were made:  Mauricio Po, [place]   03/31/2015    Review of Systems  Constitutional: Denies fever, chills,  fatigue, or significant weight gain/loss. HENT: Head: Denies headache or neck pain Ears: Denies earache, drainage Positive for decreased hearing Nose: Denies discharge, stuffiness, itching, nosebleed, sinus pain Throat: Denies sore throat, hoarseness, dry mouth, sores, thrush Eyes: Denies loss/changes in vision, pain, redness, blurry/double vision, flashing lights Cardiovascular: Denies chest pain/discomfort, tightness, palpitations, shortness of breath with activity, difficulty lying down, swelling, sudden awakening with shortness of breath Respiratory: Denies shortness of breath, cough, sputum production, wheezing Gastrointestinal: Denies dysphasia,  heartburn, change in appetite, nausea, change in bowel habits, rectal bleeding, constipation, diarrhea, yellow skin or eyes Genitourinary: Denies frequency, urgency, burning/pain, blood in urine, incontinence, change in urinary strength. Musculoskeletal: Denies muscle/joint pain, stiffness, back pain, redness or swelling of joints, trauma Skin: Denies rashes, lumps, itching, dryness, color changes, or hair/nail changes Neurological: Denies dizziness, fainting, seizures, weakness, numbness, tingling, tremor Psychiatric - Denies nervousness, stress, depression or memory loss Endocrine: Denies heat or cold intolerance, sweating, frequent urination, excessive thirst, changes in appetite Hematologic: Denies ease of bruising or bleeding    Objective:     BP 120/78 mmHg  Pulse 70  Temp(Src) 97.7 F (36.5 C) (Oral)  Resp 14  Ht 5' 8.5" (1.74 m)  Wt 122 lb 6.4 oz (55.52 kg)  BMI 18.34 kg/m2  SpO2 99% Nursing note and vital signs reviewed.  Physical Exam  Constitutional: He is oriented to person, place, and time. He appears well-developed and well-nourished. No distress.  Cardiovascular: Normal rate, regular rhythm, normal heart sounds and intact distal pulses.   Pulmonary/Chest: Effort normal and breath sounds normal.  Neurological: He is alert and oriented to person, place, and time.  Skin: Skin is warm and dry.  Psychiatric: He has a normal mood and affect. His behavior is normal. Judgment and thought content normal.       Assessment & Plan:   During the course of the visit the patient was educated and counseled about appropriate screening and preventive services including:    Pneumococcal vaccine   Influenza vaccine  Td vaccine  Prostate cancer screening  Colorectal cancer screening  Nutrition counseling   Smoking cessation counseling  Diet review for nutrition referral? Yes ____  Not Indicated _X___   Patient Instructions (the written plan) was given to the  patient.  Medicare Attestation I have personally reviewed: The patient's medical and social history Their use of alcohol, tobacco or illicit drugs Their current medications and supplements The patient's functional ability including ADLs,fall risks, home safety risks, cognitive, and hearing and visual impairment Diet and physical activities Evidence for depression or mood disorders  The patient's weight, height, BMI,  have been recorded in the chart.  I have made referrals, counseling, and provided education to the patient based on review of the above and I have provided the patient with a written personalized care plan for preventive services.     Mauricio Po, [place]   03/31/2015    Problem List Items Addressed This Visit      Nervous and Auditory   Hearing loss    Continues to experience hearing loss in the right ear. No obvious cause of his symptoms. AC is greater than BC. Refer to audiology for hearing test.  Follow up as needed.       Relevant Orders   Ambulatory referral to Audiology     Other   Medicare annual wellness visit, subsequent - Primary    Reviewed and updated patient's medical, surgical, family and social history. Medications and allergies were also reviewed. Basic screenings  for depression, activities of daily living, hearing, cognition and safety were performed. Provider list was updated and health plan was provided to the patient.   Declines flu, tetanus and Zostavax. Obtain PSA for prostate screen. Due for dental and vision exams encouraged to be completed independently. All other screenings are up to date per recommendations. Discussed continuing to eat a balanced and varied diet and walking for physical activity. Follow up prevention exam in 1 year. Follow up office visit pending blood work.       Tobacco use disorder   Relevant Orders   Comprehensive metabolic panel   CBC    Other Visit Diagnoses    Hyperlipidemia LDL goal <100        Relevant Orders     Lipid panel    PSA elevation        Relevant Orders    PSA

## 2015-03-31 NOTE — Assessment & Plan Note (Signed)
Continues to experience hearing loss in the right ear. No obvious cause of his symptoms. AC is greater than BC. Refer to audiology for hearing test.  Follow up as needed.

## 2015-03-31 NOTE — Assessment & Plan Note (Signed)
Reviewed and updated patient's medical, surgical, family and social history. Medications and allergies were also reviewed. Basic screenings for depression, activities of daily living, hearing, cognition and safety were performed. Provider list was updated and health plan was provided to the patient.   Declines flu, tetanus and Zostavax. Obtain PSA for prostate screen. Due for dental and vision exams encouraged to be completed independently. All other screenings are up to date per recommendations. Discussed continuing to eat a balanced and varied diet and walking for physical activity. Follow up prevention exam in 1 year. Follow up office visit pending blood work.

## 2015-04-03 ENCOUNTER — Other Ambulatory Visit (INDEPENDENT_AMBULATORY_CARE_PROVIDER_SITE_OTHER): Payer: Medicare Other

## 2015-04-03 DIAGNOSIS — F17200 Nicotine dependence, unspecified, uncomplicated: Secondary | ICD-10-CM | POA: Diagnosis not present

## 2015-04-03 DIAGNOSIS — R972 Elevated prostate specific antigen [PSA]: Secondary | ICD-10-CM

## 2015-04-03 DIAGNOSIS — E785 Hyperlipidemia, unspecified: Secondary | ICD-10-CM | POA: Diagnosis not present

## 2015-04-03 LAB — COMPREHENSIVE METABOLIC PANEL
ALBUMIN: 4.1 g/dL (ref 3.5–5.2)
ALT: 10 U/L (ref 0–53)
AST: 19 U/L (ref 0–37)
Alkaline Phosphatase: 79 U/L (ref 39–117)
BUN: 11 mg/dL (ref 6–23)
CALCIUM: 10.3 mg/dL (ref 8.4–10.5)
CHLORIDE: 105 meq/L (ref 96–112)
CO2: 31 meq/L (ref 19–32)
CREATININE: 1.05 mg/dL (ref 0.40–1.50)
GFR: 88.3 mL/min (ref >60.00)
Glucose, Bld: 75 mg/dL (ref 70–99)
POTASSIUM: 4.8 meq/L (ref 3.5–5.1)
SODIUM: 142 meq/L (ref 135–145)
Total Bilirubin: 0.5 mg/dL (ref 0.2–1.2)
Total Protein: 8.4 g/dL — ABNORMAL HIGH (ref 6.0–8.3)

## 2015-04-03 LAB — PSA: PSA: 0.7 ng/mL (ref 0.10–4.00)

## 2015-04-03 LAB — LIPID PANEL
CHOLESTEROL: 176 mg/dL (ref 0–200)
HDL: 50.8 mg/dL (ref >39.00)
LDL Cholesterol: 110 mg/dL — ABNORMAL HIGH (ref 0–99)
NonHDL: 125.19
Total CHOL/HDL Ratio: 3
Triglycerides: 78 mg/dL (ref 0.0–149.0)
VLDL: 15.6 mg/dL (ref 0.0–40.0)

## 2015-04-04 LAB — CBC
HEMATOCRIT: 42.4 % (ref 39.0–52.0)
Hemoglobin: 12.8 g/dL — ABNORMAL LOW (ref 13.0–17.0)
MCHC: 30.2 g/dL (ref 30.0–36.0)
MCV: 91 fl (ref 78.0–100.0)
Platelets: 165 10*3/uL (ref 150.0–400.0)
RBC: 4.66 Mil/uL (ref 4.22–5.81)
RDW: 13.6 % (ref 11.5–15.5)
WBC: 4.8 10*3/uL (ref 4.0–10.5)

## 2015-04-05 ENCOUNTER — Telehealth: Payer: Self-pay | Admitting: Family

## 2015-04-05 NOTE — Telephone Encounter (Signed)
Please inform patient that his blood work shows that his kidney function, electrolytes, liver function, white/red blood cells, prostate and cholesterol are all within good ranges. Therefore he can plan to follow up in 1 year or sooner if needed.

## 2015-04-06 NOTE — Telephone Encounter (Signed)
No answer, no voicemail.

## 2015-04-08 ENCOUNTER — Telehealth: Payer: Self-pay | Admitting: Family

## 2015-04-08 NOTE — Telephone Encounter (Signed)
Spoke with pt to inform.  

## 2015-04-08 NOTE — Telephone Encounter (Signed)
Please call back with lab results

## 2015-04-13 ENCOUNTER — Ambulatory Visit: Payer: Medicare Other | Admitting: Family

## 2015-04-14 NOTE — Telephone Encounter (Signed)
Lab results sent in the mail.

## 2015-04-20 DIAGNOSIS — H9121 Sudden idiopathic hearing loss, right ear: Secondary | ICD-10-CM | POA: Diagnosis not present

## 2015-04-20 DIAGNOSIS — H9311 Tinnitus, right ear: Secondary | ICD-10-CM | POA: Diagnosis not present

## 2015-04-20 DIAGNOSIS — J322 Chronic ethmoidal sinusitis: Secondary | ICD-10-CM | POA: Diagnosis not present

## 2015-04-20 DIAGNOSIS — H903 Sensorineural hearing loss, bilateral: Secondary | ICD-10-CM | POA: Diagnosis not present

## 2015-04-20 DIAGNOSIS — J320 Chronic maxillary sinusitis: Secondary | ICD-10-CM | POA: Diagnosis not present

## 2015-04-20 DIAGNOSIS — J040 Acute laryngitis: Secondary | ICD-10-CM | POA: Diagnosis not present

## 2015-05-08 ENCOUNTER — Telehealth: Payer: Self-pay | Admitting: Family

## 2015-05-08 ENCOUNTER — Telehealth: Payer: Self-pay | Admitting: Family Medicine

## 2015-05-08 MED ORDER — AMOXICILLIN-POT CLAVULANATE 875-125 MG PO TABS: 1.0000 | ORAL_TABLET | Freq: Two times a day (BID) | ORAL | Status: DC

## 2015-05-08 NOTE — Telephone Encounter (Signed)
[place] Patient Name: Corey Dorsey DOB: May 23, 1940 Initial Comment Caller states prescription for antibiotics should have been called in for her uncle from this office; her uncle has tooth pain from cavity or abscess Nurse Assessment Nurse: Lillia Corporal, RN, Lenell Antu Date/Time (Eastern Time): 05/08/2015 6:00:59 PM Confirm and document reason for call. If symptomatic, describe symptoms. You must click the next button to save text entered. ---Caller states prescription for antibiotics should have been called in for her uncle from this office; her uncle has tooth pain from cavity or abscess. Caller states she called office and spoke with RN, an appointment was scheduled for tomorrow at 10:15. However, she was told an antibiotic would be sent to [facility] on [facility] [phone]. Caller asks patient whether he is allergic to any medications, he denies allergies. Caller states patient has been in a lot of pain. Caller took patient to ER, where they recommended to f/u with PCP or dentist. States due to insurance limitations patient will need to wait for a dentistappointment. Has the patient traveled out of the country within the last 30 days? ---Not Applicable Does the patient have any new or worsening symptoms? ---Yes Will a triage be completed? ---Yes Related visit to physician within the last 2 weeks? ---No Does the PT have any chronic conditions? (i.e. diabetes, asthma, etc.) ---Yes List chronic conditions. ---seasonal allergies Is this a behavioral health or substance abuse call? ---No Guidelines Guideline Title Affirmed Question Affirmed Notes Toothache [1] SEVERE pain (e.g., excruciating, unable to do any normal activities) AND [2] not improved 2 hours after pain medicineFinal Disposition User See Physician within 4 Hours (or PCP triage) Lillia Corporal, RN, [place] Comments Site is lower left side,  molar is fractured. Caller states that waiting times in ER were over 4 hours and patient cannot tolerate waiting that long. 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 She will have patient seen as scheduled tomorrow morning at [facility].Referrals REFERRED TO PCP OFFICE Disagree/Comply: Comply Call Id: [id number]

## 2015-05-08 NOTE — Telephone Encounter (Signed)
Pt called wondering if Marya Amsler can send in antibiotic for tooth ache. Dentis is closed and ER can not do anything until he get antibiotic. Please help, pt used [facility].

## 2015-05-08 NOTE — Telephone Encounter (Signed)
Augmentin sent to pharmacy.

## 2015-05-08 NOTE — Telephone Encounter (Signed)
Received a call from [place], stating that pt is expecting rx at pharmacy for Augmentin for toothache and rx was not a pharmacy. Reviewed chart.  It does appear it was told rx would be sent.  eRx restent for Augmentin to [facility] on [address].

## 2015-05-09 ENCOUNTER — Ambulatory Visit (INDEPENDENT_AMBULATORY_CARE_PROVIDER_SITE_OTHER): Payer: Medicare Other | Admitting: Family Medicine

## 2015-05-09 ENCOUNTER — Encounter: Payer: Self-pay | Admitting: Family Medicine

## 2015-05-09 VITALS — BP 110/78 | HR 70 | Temp 98.2°F | Resp 17 | Wt 125.8 lb

## 2015-05-09 DIAGNOSIS — K047 Periapical abscess without sinus: Secondary | ICD-10-CM | POA: Insufficient documentation

## 2015-05-09 MED ORDER — HYDROCODONE-ACETAMINOPHEN 5-325 MG PO TABS: 1.0000 | ORAL_TABLET | Freq: Four times a day (QID) | ORAL | Status: DC | PRN

## 2015-05-09 NOTE — Progress Notes (Signed)
Pre visit review using our clinic review tool, if applicable. No additional management support is needed unless otherwise documented below in the visit note. 

## 2015-05-09 NOTE — Progress Notes (Signed)
Subjective:   Patient ID: Corey Dorsey, male    DOB: 09/25/1940, 75 y.o.   MRN: [id number]  Corey Dorsey is a pleasant 75 y.o. year old male pt of Terri Piedra, new to me, who presents to weekend clinic today with his niece for Dental Pain  on 05/09/2015  HPI:  Called PCP yesterday complaining of tooth pain/dental abscess. Per note, pt reported that he called his dentist but office was closed and was told to call PCP for antibiotic and pain rx.  Augmentin was sent in yesterday.  He is here today for "pain medication."  Has had no fever, chills, nausea or vomiting.  He is able to eat but not on right left side where his tooth hurts.  Taking OTC NSAIDs but it is still keeping him up at night- can be a "10 out of 10."  Current Outpatient Prescriptions on File Prior to Visit  Medication Sig Dispense Refill  . amoxicillin-clavulanate (AUGMENTIN) 875-125 MG tablet Take 1 tablet by mouth 2 (two) times daily. 20 tablet 0   No current facility-administered medications on file prior to visit.    No Known Allergies  Past Medical History  Diagnosis Date  . Hearing loss     Past Surgical History  Procedure Laterality Date  . Abdominal surgery      History reviewed. No pertinent family history.  Social History   Social History  . Marital Status: Single    Spouse Name: N/A  . Number of Children: 2  . Years of Education: 11   Occupational History  . Retired    Social History Main Topics  . Smoking status: Current Some Day Smoker -- 0.15 packs/day for 58 years    Types: Cigarettes  . Smokeless tobacco: Never Used  . Alcohol Use: 1.2 oz/week    2 Cans of beer per week  . Drug Use: No  . Sexual Activity: Not on file   Other Topics Concern  . Not on file   Social History Narrative   Fun: Walks regularly, likes sports   The PMH, PSH, Social History, Family History, Medications, and allergies have been reviewed in [facility], and have been updated if  relevant.   Review of Systems  Constitutional: Negative.   HENT: Positive for dental problem and facial swelling. Negative for trouble swallowing.   Cardiovascular: Negative.   Gastrointestinal: Negative.   Musculoskeletal: Negative.   Psychiatric/Behavioral: Negative.   All other systems reviewed and are negative.      Objective:    BP 110/78 mmHg  Pulse 70  Temp(Src) 98.2 F (36.8 C) (Oral)  Resp 17  Wt 125 lb 12 oz (57.04 kg)  SpO2 98%   Physical Exam  Constitutional: He is oriented to person, place, and time. He appears well-developed and well-nourished. No distress.  HENT:  Head: Normocephalic.  Mouth/Throat: Uvula is midline and oropharynx is clear and moist. Dental abscesses and dental caries present.    Eyes: Conjunctivae are normal.  Neck: Neck supple.  Cardiovascular: Normal rate.   Pulmonary/Chest: Effort normal.  Musculoskeletal: Normal range of motion.  Neurological: He is alert and oriented to person, place, and time. No cranial nerve deficit.  Skin: Skin is warm and dry. He is not diaphoretic.  Psychiatric: He has a normal mood and affect. His behavior is normal. Judgment and thought content normal.  Nursing note and vitals reviewed.         Assessment & Plan:   Dental abscess No Follow-up on file.

## 2015-05-09 NOTE — Assessment & Plan Note (Signed)
New- advised to finish course of Augmentin as prescribed. Rx printed for [place] and advised to get in to see dentist ASAP. The patient indicates understanding of these issues and agrees with the plan.

## 2016-04-28 ENCOUNTER — Encounter (HOSPITAL_BASED_OUTPATIENT_CLINIC_OR_DEPARTMENT_OTHER): Payer: Self-pay | Admitting: Emergency Medicine

## 2016-04-28 ENCOUNTER — Emergency Department (HOSPITAL_BASED_OUTPATIENT_CLINIC_OR_DEPARTMENT_OTHER)
Admission: EM | Admit: 2016-04-28 | Discharge: 2016-04-28 | Disposition: A | Discharge status: Home / Self Care | Payer: Medicare Other | Attending: Emergency Medicine | Admitting: Emergency Medicine

## 2016-04-28 ENCOUNTER — Emergency Department (HOSPITAL_BASED_OUTPATIENT_CLINIC_OR_DEPARTMENT_OTHER): Payer: Medicare Other

## 2016-04-28 DIAGNOSIS — Y999 Unspecified external cause status: Secondary | ICD-10-CM | POA: Diagnosis not present

## 2016-04-28 DIAGNOSIS — Z23 Encounter for immunization: Secondary | ICD-10-CM | POA: Diagnosis not present

## 2016-04-28 DIAGNOSIS — M25511 Pain in right shoulder: Secondary | ICD-10-CM | POA: Insufficient documentation

## 2016-04-28 DIAGNOSIS — S50811A Abrasion of right forearm, initial encounter: Secondary | ICD-10-CM | POA: Diagnosis not present

## 2016-04-28 DIAGNOSIS — S4991XA Unspecified injury of right shoulder and upper arm, initial encounter: Secondary | ICD-10-CM | POA: Diagnosis not present

## 2016-04-28 DIAGNOSIS — S50819A Abrasion of unspecified forearm, initial encounter: Secondary | ICD-10-CM

## 2016-04-28 DIAGNOSIS — Y929 Unspecified place or not applicable: Secondary | ICD-10-CM | POA: Diagnosis not present

## 2016-04-28 DIAGNOSIS — S59911A Unspecified injury of right forearm, initial encounter: Secondary | ICD-10-CM | POA: Diagnosis present

## 2016-04-28 DIAGNOSIS — Y9389 Activity, other specified: Secondary | ICD-10-CM | POA: Diagnosis not present

## 2016-04-28 DIAGNOSIS — F17210 Nicotine dependence, cigarettes, uncomplicated: Secondary | ICD-10-CM | POA: Diagnosis not present

## 2016-04-28 MED ORDER — NAPROXEN 375 MG PO TABS: 375.0000 mg | ORAL_TABLET | Freq: Two times a day (BID) | ORAL | 0 refills | Status: DC

## 2016-04-28 MED ORDER — TETANUS-DIPHTH-ACELL PERTUSSIS 5-2.5-18.5 LF-MCG/0.5 IM SUSP
0.5000 mL | Freq: Once | INTRAMUSCULAR | Status: AC
  Administered 2016-04-28: 0.5 mL via INTRAMUSCULAR
  Filled 2016-04-28: qty 0.5

## 2016-04-28 NOTE — ED Notes (Signed)
Patient given an ice pack.
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
## 2016-11-15 ENCOUNTER — Ambulatory Visit (INDEPENDENT_AMBULATORY_CARE_PROVIDER_SITE_OTHER): Payer: Medicare Other | Admitting: Family

## 2016-11-15 ENCOUNTER — Encounter: Payer: Self-pay | Admitting: Family

## 2016-11-15 VITALS — BP 132/76 | HR 68 | Temp 97.9°F | Resp 16 | Ht 68.0 in | Wt 132.8 lb

## 2016-11-15 DIAGNOSIS — R109 Unspecified abdominal pain: Secondary | ICD-10-CM | POA: Diagnosis not present

## 2016-11-15 DIAGNOSIS — R1900 Intra-abdominal and pelvic swelling, mass and lump, unspecified site: Secondary | ICD-10-CM | POA: Diagnosis not present

## 2016-11-15 DIAGNOSIS — Z125 Encounter for screening for malignant neoplasm of prostate: Secondary | ICD-10-CM | POA: Diagnosis not present

## 2016-11-15 DIAGNOSIS — E7800 Pure hypercholesterolemia, unspecified: Secondary | ICD-10-CM | POA: Diagnosis not present

## 2016-11-15 DIAGNOSIS — F17200 Nicotine dependence, unspecified, uncomplicated: Secondary | ICD-10-CM | POA: Diagnosis not present

## 2016-11-15 DIAGNOSIS — Z0000 Encounter for general adult medical examination without abnormal findings: Secondary | ICD-10-CM | POA: Diagnosis not present

## 2016-11-15 NOTE — Assessment & Plan Note (Addendum)
Reviewed and updated patient's medical, surgical, family and social history. Medications and allergies were also reviewed. Basic screenings for depression, activities of daily living, hearing, cognition and safety were performed. Provider list was updated and health plan was provided to the patient.   Overall adequate exam with risk factors for cardiovascular disease including tobacco usage. He exercises regularly and eats well. Declines Pneumonia and influenza today. Continue other healthy lifestyle behaviors and choices. Follow up prevention exam in 1 year and follow up office visit for chronic conditions.

## 2016-11-15 NOTE — Assessment & Plan Note (Signed)
Continues to smoke approximately 1-2 cigarettes per day. Discussed continued risk for malignancy, respiratory and cardiovascular disease in the future. He is in the pre-contemplation stage of change.

## 2016-11-15 NOTE — Assessment & Plan Note (Signed)
New onset mass of abdomen. No rigidity or guarding. Concern for underlying malignancy or possible aneurysm. Obtain CT scan of abdomen. Advised to seek immediate care if pain develops or if symptoms worsen. Additional treatment pending CT results.

## 2016-11-15 NOTE — Patient Instructions (Addendum)
Thank you for choosing [facility].  SUMMARY AND INSTRUCTIONS:  Please complete your blood work when fasting.  We will order a CT scan of your abdomen for follow up.  For your ear - recommend follow up with ENT (Dr. Janeice Robinson office)  Labs:  Please stop by the lab on the lower level of the building for your blood work. Your results will be released to [place] (or called to you) after review, usually within 72 hours after test completion. If any changes need to be made, you will be notified at that same time.  1.) The lab is open from 7:30am to 5:30 pm Monday-Friday 2.) No appointment is necessary 3.) Fasting (if needed) is 6-8 hours after food and drink; black coffee and water are okay   Follow up:  If your symptoms worsen or fail to improve, please contact our office for further instruction, or in case of emergency go directly to the emergency room at the closest medical facility.   Health Maintenance  Topic Date Due  . PNA vac Low Risk Adult (2 of 2 - PPSV23) 03/30/2016  . INFLUENZA VACCINE  05/21/2017 (Originally 09/21/2016)  . TETANUS/TDAP  04/29/2026    Health Maintenance, Male A healthy lifestyle and preventive care is important for your health and wellness. Ask your health care provider about what schedule of regular examinations is right for you. What should I know about weight and diet? Eat a Healthy Diet  Eat plenty of vegetables, fruits, whole grains, low-fat dairy products, and lean protein.  Do not eat a lot of foods high in solid fats, added sugars, or salt.  Maintain a Healthy Weight Regular exercise can help you achieve or maintain a healthy weight. You should:  Do at least 150 minutes of exercise each week. The exercise should increase your heart rate and make you sweat (moderate-intensity exercise).  Do strength-training exercises at least twice a week.  Watch Your Levels of Cholesterol and Blood Lipids  Have your blood tested for lipids and  cholesterol every 5 years starting at 77 years of age. If you are at high risk for heart disease, you should start having your blood tested when you are 77 years old. You may need to have your cholesterol levels checked more often if: ? Your lipid or cholesterol levels are high. ? You are older than 77 years of age. ? You are at high risk for heart disease.  What should I know about cancer screening? Many types of cancers can be detected early and may often be prevented. Lung Cancer  You should be screened every year for lung cancer if: ? You are a current smoker who has smoked for at least 30 years. ? You are a former smoker who has quit within the past 15 years.  Talk to your health care provider about your screening options, when you should start screening, and how often you should be screened.  Colorectal Cancer  Routine colorectal cancer screening usually begins at 77 years of age and should be repeated every 5-10 years until you are 77 years old. You may need to be screened more often if early forms of precancerous polyps or small growths are found. Your health care provider may recommend screening at an earlier age if you have risk factors for colon cancer.  Your health care provider may recommend using home test kits to check for hidden blood in the stool.  A small camera at the end of a tube can be used to  examine your colon (sigmoidoscopy or colonoscopy). This checks for the earliest forms of colorectal cancer.  Prostate and Testicular Cancer  Depending on your age and overall health, your health care provider may do certain tests to screen for prostate and testicular cancer.  Talk to your health care provider about any symptoms or concerns you have about testicular or prostate cancer.  Skin Cancer  Check your skin from head to toe regularly.  Tell your health care provider about any new moles or changes in moles, especially if: ? There is a change in a mole's size, shape,  or color. ? You have a mole that is larger than a pencil eraser.  Always use sunscreen. Apply sunscreen liberally and repeat throughout the day.  Protect yourself by wearing long sleeves, pants, a wide-brimmed hat, and sunglasses when outside.  What should I know about heart disease, diabetes, and high blood pressure?  If you are 4-103 years of age, have your blood pressure checked every 3-5 years. If you are 48 years of age or older, have your blood pressure checked every year. You should have your blood pressure measured twice-once when you are at a hospital or clinic, and once when you are not at a hospital or clinic. Record the average of the two measurements. To check your blood pressure when you are not at a hospital or clinic, you can use: ? An automated blood pressure machine at a pharmacy. ? A home blood pressure monitor.  Talk to your health care provider about your target blood pressure.  If you are between 41-29 years old, ask your health care provider if you should take aspirin to prevent heart disease.  Have regular diabetes screenings by checking your fasting blood sugar level. ? If you are at a normal weight and have a low risk for diabetes, have this test once every three years after the age of 55. ? If you are overweight and have a high risk for diabetes, consider being tested at a younger age or more often.  A one-time screening for abdominal aortic aneurysm (AAA) by ultrasound is recommended for men aged 30-75 years who are current or former smokers. What should I know about preventing infection? Hepatitis B If you have a higher risk for hepatitis B, you should be screened for this virus. Talk with your health care provider to find out if you are at risk for hepatitis B infection. Hepatitis C Blood testing is recommended for:  Everyone born from [birth year] through 1966.  Anyone with known risk factors for hepatitis C.  Sexually Transmitted Diseases (STDs)  You should  be screened each year for STDs including gonorrhea and chlamydia if: ? You are sexually active and are younger than 77 years of age. ? You are older than 77 years of age and your health care provider tells you that you are at risk for this type of infection. ? Your sexual activity has changed since you were last screened and you are at an increased risk for chlamydia or gonorrhea. Ask your health care provider if you are at risk.  Talk with your health care provider about whether you are at high risk of being infected with HIV. Your health care provider may recommend a prescription medicine to help prevent HIV infection.  What else can I do?  Schedule regular health, dental, and eye exams.  Stay current with your vaccines (immunizations).  Do not use any tobacco products, such as cigarettes, chewing tobacco, and e-cigarettes. If you need  help quitting, ask your health care provider.  Limit alcohol intake to no more than 2 drinks per day. One drink equals 12 ounces of beer, 5 ounces of wine, or 1 ounces of hard liquor.  Do not use street drugs.  Do not share needles.  Ask your health care provider for help if you need support or information about quitting drugs.  Tell your health care provider if you often feel depressed.  Tell your health care provider if you have ever been abused or do not feel safe at home. This information is not intended to replace advice given to you by your health care provider. Make sure you discuss any questions you have with your health care provider. Document Released: 08/06/2007 Document Revised: 10/07/2015 Document Reviewed: 11/11/2014 Elsevier Interactive Patient Education  [employer].

## 2016-11-15 NOTE — Progress Notes (Signed)
Subjective:    Patient ID: Corey Dorsey, male    DOB: 1940/12/11, 76 y.o.   MRN: [id number]  Chief Complaint  Patient presents with  . CPE    not fasting    HPI:  Corey Dorsey is a 76 y.o. male who presents today for a Medicare Annual Wellness/Physical exam.    1) Health Maintenance -   Diet - Averages about 2-3 meals per day consisting of a regular diet; occasional snacks; caffeine intake of about 1-2 cups per day  Exercise - walks on a daily basis   2) Preventative Exams / Immunizations:  Dental -- Due for exam   Vision -- Due for exam    Health Maintenance  Topic Date Due  . PNA vac Low Risk Adult (2 of 2 - PPSV23) 03/30/2016  . INFLUENZA VACCINE  05/21/2017 (Originally 09/21/2016)  . TETANUS/TDAP  04/29/2026     Immunization History  Administered Date(s) Administered  . Pneumococcal Conjugate-13 03/31/2015  . Tdap 04/28/2016    RISK FACTORS  Tobacco History  Smoking Status  . Current Some Day Smoker  . Packs/day: 0.15  . Years: 58.00  . Types: Cigarettes  Smokeless Tobacco  . Never Used     Cardiac risk factors: advanced age (older than 42 for men, 41 for women), male gender and smoking/ tobacco exposure.  Depression Screen  Depression screen [facility] 2/9 11/15/2016  Decreased Interest 0  Down, Depressed, Hopeless 0  PHQ - 2 Score 0     Activities of Daily Living In your present state of health, do you have any difficulty performing the following activities?:  Driving? No Managing money?  No Feeding yourself? No Getting from bed to chair? No Climbing a flight of stairs? No Preparing food and eating?: No Bathing or showering? No Getting dressed: No Getting to the toilet? No Using the toilet: No Moving around from place to place: No In the past year have you fallen or had a near fall?:No   Home Safety Has smoke detector and wears seat belts. No firearms. No excess sun exposure. Are there smokers in your home (other than you)?   No Do you feel safe at home?  Yes  Hearing Difficulties: No Do you often ask people to speak up or repeat themselves? No Do you experience ringing or noises in your ears? No  Do you have difficulty understanding soft or whispered voices? No    Cognitive Testing  Alert? Yes   Normal Appearance? Yes  Oriented to person? Yes  Place? Yes   Time? Yes  Recall of three objects?  Yes  Can perform simple calculations? Yes  Displays appropriate judgment? Yes  Can read the correct time from a watch face? Yes  Do you feel that you have a problem with memory? Occasional   Do you often misplace items? Occasional   Advanced Directives have been discussed with the patient? Yes   Current Physicians/Providers and Suppliers  1. Terri Piedra, FNP - Internal Medicine  Indicate any recent Medical Services you may have received from other than [facility] providers in the past year (date may be approximate).  All answers were reviewed with the patient and necessary referrals were made:  Mauricio Po, [place]   11/15/2016    3.) Abdominal mass - this is a new problem. Associated symptom of a mass located in his periumbilical region of his abdomen has been going on for an undisclosed period of time. Described as firm and mild tenderness to the  left side. Denies any trauma or injury. No nausea, vomiting, diarrhea, or constipation. No modifying factors or attempted treatments.   No Known Allergies   Outpatient Medications Prior to Visit  Medication Sig Dispense Refill  . amoxicillin-clavulanate (AUGMENTIN) 875-125 MG tablet Take 1 tablet by mouth 2 (two) times daily. 20 tablet 0  . HYDROcodone-acetaminophen (NORCO/VICODIN) 5-325 MG tablet Take 1 tablet by mouth every 6 (six) hours as needed for severe pain. 20 tablet 0  . naproxen (NAPROSYN) 375 MG tablet Take 1 tablet (375 mg total) by mouth 2 (two) times daily. 20 tablet 0   No facility-administered medications prior to visit.      Past Medical  History:  Diagnosis Date  . Hearing loss      Past Surgical History:  Procedure Laterality Date  . ABDOMINAL SURGERY       History reviewed. No pertinent family history.   Social History   Social History  . Marital status: Single    Spouse name: N/A  . Number of children: 2  . Years of education: 11   Occupational History  . Retired    Social History Main Topics  . Smoking status: Current Some Day Smoker    Packs/day: 0.15    Years: 58.00    Types: Cigarettes  . Smokeless tobacco: Never Used  . Alcohol use 1.2 oz/week    2 Cans of beer per week  . Drug use: No  . Sexual activity: Not on file   Other Topics Concern  . Not on file   Social History Narrative   Fun: Walks regularly, likes sports, fishing     Review of Systems  Constitutional: Denies fever, chills, fatigue, or significant weight gain/loss. HENT: Head: Denies headache or neck pain Ears: Denies changes in hearing, ringing in ears, earache, drainage Nose: Denies discharge, stuffiness, itching, nosebleed, sinus pain Throat: Denies sore throat, hoarseness, dry mouth, sores, thrush Eyes: Denies loss/changes in vision, pain, redness, blurry/double vision, flashing lights Cardiovascular: Denies chest pain/discomfort, tightness, palpitations, shortness of breath with activity, difficulty lying down, swelling, sudden awakening with shortness of breath Respiratory: Denies shortness of breath, cough, sputum production, wheezing Gastrointestinal: Denies dysphasia, heartburn, change in appetite, nausea, change in bowel habits, rectal bleeding, constipation, diarrhea, yellow skin or eyes Genitourinary: Denies frequency, urgency, burning/pain, blood in urine, incontinence, change in urinary strength. Musculoskeletal: Denies muscle/joint pain, stiffness, back pain, redness or swelling of joints, trauma Skin: Denies rashes, lumps, itching, dryness, color changes, or hair/nail changes Neurological: Denies  dizziness, fainting, seizures, weakness, numbness, tingling, tremor Psychiatric - Denies nervousness, stress, depression or memory loss Endocrine: Denies heat or cold intolerance, sweating, frequent urination, excessive thirst, changes in appetite Hematologic: Denies ease of bruising or bleeding    Objective:     BP 132/76 (BP Location: Left Arm, Patient Position: Sitting, Cuff Size: Normal)   Pulse 68   Temp 97.9 F (36.6 C) (Oral)   Resp 16   Ht 5\' 8"  (1.727 m)   Wt 132 lb 12.8 oz (60.2 kg)   SpO2 95%   BMI 20.19 kg/m  Nursing note and vital signs reviewed.  Physical Exam  Constitutional: He is oriented to person, place, and time. He appears well-developed and well-nourished. No distress.  Cardiovascular: Normal rate, regular rhythm, normal heart sounds and intact distal pulses.   Pulmonary/Chest: Effort normal and breath sounds normal.  Abdominal: Normal appearance and bowel sounds are normal. He exhibits mass. There is no hepatosplenomegaly. There is tenderness in the periumbilical area. There  is no rigidity, no rebound, no guarding, no CVA tenderness, no tenderness at McBurney's point and negative Murphy's sign.    Neurological: He is alert and oriented to person, place, and time.  Skin: Skin is warm and dry.  Psychiatric: He has a normal mood and affect. His behavior is normal. Judgment and thought content normal.       Assessment & Plan:   During the course of the visit the patient was educated and counseled about appropriate screening and preventive services including:    Pneumococcal vaccine   Influenza vaccine  Prostate cancer screening  Colorectal cancer screening  Glaucoma screening  Nutrition counseling   Diet review for nutrition referral? Yes ____  Not Indicated _X___   Patient Instructions (the written plan) was given to the patient.  Medicare Attestation I have personally reviewed: The patient's medical and social history Their use of  alcohol, tobacco or illicit drugs Their current medications and supplements The patient's functional ability including ADLs,fall risks, home safety risks, cognitive, and hearing and visual impairment Diet and physical activities Evidence for depression or mood disorders  The patient's weight, height, BMI,  have been recorded in the chart.  I have made referrals, counseling, and provided education to the patient based on review of the above and I have provided the patient with a written personalized care plan for preventive services.     Problem List Items Addressed This Visit      Other   Medicare annual wellness visit, subsequent - Primary    Reviewed and updated patient's medical, surgical, family and social history. Medications and allergies were also reviewed. Basic screenings for depression, activities of daily living, hearing, cognition and safety were performed. Provider list was updated and health plan was provided to the patient.   Overall adequate exam with risk factors for cardiovascular disease including tobacco usage. He exercises regularly and eats well. Declines Pneumonia and influenza today. Continue other healthy lifestyle behaviors and choices. Follow up prevention exam in 1 year and follow up office visit for chronic conditions.          Tobacco use disorder    Continues to smoke approximately 1-2 cigarettes per day. Discussed continued risk for malignancy, respiratory and cardiovascular disease in the future. He is in the pre-contemplation stage of change.       Elevated LDL cholesterol level   Relevant Orders   Lipid panel    Other Visit Diagnoses    Intra-abdominal and pelvic swelling, mass and lump, unspecified site       Relevant Orders   CBC   Comprehensive metabolic panel   CT Abdomen Pelvis Wo Contrast   Screening for prostate cancer       Relevant Orders   PSA, Medicare       I have discontinued Mr. Gutter's amoxicillin-clavulanate,  HYDROcodone-acetaminophen, and naproxen.   Follow-up: Return in about 1 month (around 12/15/2016), or if symptoms worsen or fail to improve.   Mauricio Po, FNP

## 2016-11-17 ENCOUNTER — Other Ambulatory Visit (INDEPENDENT_AMBULATORY_CARE_PROVIDER_SITE_OTHER): Payer: Medicare Other

## 2016-11-17 DIAGNOSIS — E7800 Pure hypercholesterolemia, unspecified: Secondary | ICD-10-CM

## 2016-11-17 DIAGNOSIS — R1900 Intra-abdominal and pelvic swelling, mass and lump, unspecified site: Secondary | ICD-10-CM | POA: Diagnosis not present

## 2016-11-17 DIAGNOSIS — Z125 Encounter for screening for malignant neoplasm of prostate: Secondary | ICD-10-CM | POA: Diagnosis not present

## 2016-11-17 LAB — COMPREHENSIVE METABOLIC PANEL
ALBUMIN: 4 g/dL (ref 3.5–5.2)
ALK PHOS: 64 U/L (ref 39–117)
ALT: 16 U/L (ref 0–53)
AST: 22 U/L (ref 0–37)
BUN: 12 mg/dL (ref 6–23)
CO2: 27 mEq/L (ref 19–32)
Calcium: 9.7 mg/dL (ref 8.4–10.5)
Chloride: 104 mEq/L (ref 96–112)
Creatinine, Ser: 1.06 mg/dL (ref 0.40–1.50)
GFR: 86.96 mL/min (ref >60.00)
Glucose, Bld: 83 mg/dL (ref 70–99)
POTASSIUM: 3.8 meq/L (ref 3.5–5.1)
Sodium: 139 mEq/L (ref 135–145)
TOTAL PROTEIN: 7.5 g/dL (ref 6.0–8.3)
Total Bilirubin: 1 mg/dL (ref 0.2–1.2)

## 2016-11-17 LAB — CBC
HEMATOCRIT: 42.4 % (ref 39.0–52.0)
HEMOGLOBIN: 13.6 g/dL (ref 13.0–17.0)
MCHC: 32.1 g/dL (ref 30.0–36.0)
MCV: 88.7 fl (ref 78.0–100.0)
Platelets: 147 10*3/uL — ABNORMAL LOW (ref 150.0–400.0)
RBC: 4.77 Mil/uL (ref 4.22–5.81)
RDW: 14.6 % (ref 11.5–15.5)
WBC: 4.9 10*3/uL (ref 4.0–10.5)

## 2016-11-17 LAB — PSA, MEDICARE: PSA: 0.79 ng/mL (ref 0.10–4.00)

## 2016-11-17 LAB — LIPID PANEL
CHOLESTEROL: 199 mg/dL (ref 0–200)
HDL: 69.7 mg/dL (ref >39.00)
LDL CALC: 113 mg/dL — AB (ref 0–99)
NonHDL: 129.75
TRIGLYCERIDES: 85 mg/dL (ref 0.0–149.0)
Total CHOL/HDL Ratio: 3
VLDL: 17 mg/dL (ref 0.0–40.0)

## 2016-11-18 DIAGNOSIS — H93291 Other abnormal auditory perceptions, right ear: Secondary | ICD-10-CM | POA: Diagnosis not present

## 2016-12-07 NOTE — Addendum Note (Signed)
Addended by: Delice Bison E on: 12/07/2016 09:00 AM   Modules accepted: Orders

## 2016-12-14 ENCOUNTER — Other Ambulatory Visit: Payer: Medicare Other

## 2017-01-21 ENCOUNTER — Other Ambulatory Visit: Payer: Self-pay

## 2017-01-21 ENCOUNTER — Emergency Department (HOSPITAL_BASED_OUTPATIENT_CLINIC_OR_DEPARTMENT_OTHER): Payer: Medicare Other

## 2017-01-21 ENCOUNTER — Emergency Department (HOSPITAL_BASED_OUTPATIENT_CLINIC_OR_DEPARTMENT_OTHER)
Admission: EM | Admit: 2017-01-21 | Discharge: 2017-01-21 | Disposition: A | Discharge status: Home / Self Care | Payer: Medicare Other | Attending: Emergency Medicine | Admitting: Emergency Medicine

## 2017-01-21 ENCOUNTER — Encounter (HOSPITAL_BASED_OUTPATIENT_CLINIC_OR_DEPARTMENT_OTHER): Payer: Self-pay | Admitting: *Deleted

## 2017-01-21 DIAGNOSIS — Y999 Unspecified external cause status: Secondary | ICD-10-CM | POA: Insufficient documentation

## 2017-01-21 DIAGNOSIS — W010XXA Fall on same level from slipping, tripping and stumbling without subsequent striking against object, initial encounter: Secondary | ICD-10-CM | POA: Diagnosis not present

## 2017-01-21 DIAGNOSIS — S43012A Anterior subluxation of left humerus, initial encounter: Secondary | ICD-10-CM | POA: Diagnosis not present

## 2017-01-21 DIAGNOSIS — F17210 Nicotine dependence, cigarettes, uncomplicated: Secondary | ICD-10-CM | POA: Insufficient documentation

## 2017-01-21 DIAGNOSIS — S43015A Anterior dislocation of left humerus, initial encounter: Secondary | ICD-10-CM | POA: Diagnosis not present

## 2017-01-21 DIAGNOSIS — Y9301 Activity, walking, marching and hiking: Secondary | ICD-10-CM | POA: Insufficient documentation

## 2017-01-21 DIAGNOSIS — S4992XA Unspecified injury of left shoulder and upper arm, initial encounter: Secondary | ICD-10-CM | POA: Diagnosis present

## 2017-01-21 DIAGNOSIS — M25512 Pain in left shoulder: Secondary | ICD-10-CM | POA: Diagnosis not present

## 2017-01-21 DIAGNOSIS — S43005A Unspecified dislocation of left shoulder joint, initial encounter: Secondary | ICD-10-CM

## 2017-01-21 DIAGNOSIS — S43006A Unspecified dislocation of unspecified shoulder joint, initial encounter: Secondary | ICD-10-CM | POA: Diagnosis not present

## 2017-01-21 DIAGNOSIS — Y92009 Unspecified place in unspecified non-institutional (private) residence as the place of occurrence of the external cause: Secondary | ICD-10-CM | POA: Diagnosis not present

## 2017-01-21 DIAGNOSIS — M21922 Unspecified acquired deformity of left upper arm: Secondary | ICD-10-CM

## 2017-01-21 MED ORDER — HYDROMORPHONE HCL 1 MG/ML IJ SOLN
1.0000 mg | Freq: Once | INTRAMUSCULAR | Status: AC
  Administered 2017-01-21: 1 mg via INTRAVENOUS
  Filled 2017-01-21: qty 1

## 2017-01-21 MED ORDER — FENTANYL CITRATE (PF) 100 MCG/2ML IJ SOLN
50.0000 ug | Freq: Once | INTRAMUSCULAR | Status: AC
  Administered 2017-01-21: 50 ug via INTRAVENOUS
  Filled 2017-01-21: qty 2

## 2017-01-21 MED ORDER — LORAZEPAM 2 MG/ML IJ SOLN
0.5000 mg | Freq: Once | INTRAMUSCULAR | Status: AC
  Administered 2017-01-21: 0.5 mg via INTRAVENOUS
  Filled 2017-01-21: qty 1

## 2017-01-21 MED ORDER — HYDROMORPHONE HCL 1 MG/ML IJ SOLN
INTRAMUSCULAR | Status: AC
  Filled 2017-01-21: qty 1

## 2017-01-21 MED ORDER — HYDROMORPHONE HCL 1 MG/ML IJ SOLN
0.5000 mg | Freq: Once | INTRAMUSCULAR | Status: AC
  Administered 2017-01-21: 0.5 mg via INTRAVENOUS

## 2017-01-21 NOTE — Discharge Instructions (Signed)
It was our pleasure to provide your ER care today - we hope that you feel better.  Ice/coldpack to sore area.  Take acetaminophen and/or ibuprofen as need for pain.    Wear sling for comfort/support for the next couple weeks.   Follow up with orthopedist in 1 week - call office Monday to arrange follow up appointment.   Return to ER if worse, new symptoms, worsening or intractable pain, numbness/weakness, other concern.   You were given pain medication in the  ER - no driving for the next 12 hours.

## 2017-01-21 NOTE — ED Triage Notes (Signed)
Pt c/o left shoulder injury with dislocation x 1 day

## 2017-01-21 NOTE — ED Notes (Signed)
Assumed care of patient from [place]. In to assess patient and patient is drowsy. Responds to sternal rub. Placed on pulse Ox/BP - Pulse Ox down to 82% on Room Air. O2 applied via [place] @ 3lpm, with O2 sat increasing to 93%. HR and BP WNL. RT to bedside. Dr. Ashok Cordia notified and at bedside to evaluate patient.

## 2017-01-21 NOTE — ED Notes (Signed)
Left arm sling is intact.  Pt is very comfortable and drowsy. Arouses to touch and loud voice.

## 2017-01-21 NOTE — ED Notes (Addendum)
Pt unable to wake enough to drink fluid at this time. RN aware.

## 2017-01-21 NOTE — ED Notes (Signed)
Tried to call patient's family member for him to be pick up.  Patient is too sedated to walk.  No answer from the number ([phone]).  Message left that patient is ready to be discharge.

## 2017-01-21 NOTE — ED Notes (Signed)
Portable xray at bedside.

## 2017-01-21 NOTE — ED Notes (Signed)
Family at bedside. 

## 2017-01-21 NOTE — ED Provider Notes (Signed)
[name] Provider Note   CSN: [id number] Arrival date & time: 01/21/17  1007     History   Chief Complaint Chief Complaint  Patient presents with  . Shoulder Injury    HPI Corey Dorsey is a 76 y.o. male.  Patient s/p fall yesterday at home.  Pt indicates he tripped. C/o injury to left shoulder. Denies other pain or injury. no headache. No neck or back pain. No chest pain or sob. Denies abd pain. No nausea or vomiting. Pt denies left arm numbness or weakness. Prior left shoulder dislocation. Skin is intact. Pt states otherwise feels health c/w baseline.     The history is provided by the patient.  Shoulder Injury  Pertinent negatives include no chest pain, no abdominal pain, no headaches and no shortness of breath.    Past Medical History:  Diagnosis Date  . Hearing loss     Patient Active Problem List   Diagnosis Date Noted  . Mass of abdomen 11/15/2016  . Elevated LDL cholesterol level 11/15/2016  . Dental abscess 05/09/2015  . Medicare annual wellness visit, subsequent 03/31/2015  . Tobacco use disorder 03/31/2015  . Hearing loss     Past Surgical History:  Procedure Laterality Date  . ABDOMINAL SURGERY         Home Medications    Prior to Admission medications   Not on File    Family History No family history on file.  Social History Social History   Tobacco Use  . Smoking status: Current Some Day Smoker    Packs/day: 0.15    Years: 58.00    Pack years: 8.70    Types: Cigarettes  . Smokeless tobacco: Never Used  Substance Use Topics  . Alcohol use: Yes    Alcohol/week: 1.2 oz    Types: 2 Cans of beer per week  . Drug use: No     Allergies   Patient has no known allergies.   Review of Systems Review of Systems  Constitutional: Negative for fever.  HENT: Negative for nosebleeds.   Eyes: Negative for pain.  Respiratory: Negative for shortness of breath.   Cardiovascular: Negative for chest  pain.  Gastrointestinal: Negative for abdominal pain and vomiting.  Genitourinary: Negative for flank pain.  Musculoskeletal: Negative for back pain and neck pain.  Skin: Negative for wound.  Neurological: Negative for weakness, numbness and headaches.  Hematological: Does not bruise/bleed easily.  Psychiatric/Behavioral: Negative for confusion.     Physical Exam Updated Vital Signs BP (!) 178/108 (BP Location: Right Arm)   Pulse 85   Temp 98.2 F (36.8 C)   Resp 16   Ht 1.626 m (5\' 4" )   Wt 59.9 kg (132 lb)   SpO2 95%   BMI 22.66 kg/m   Physical Exam  Constitutional: He appears well-developed and well-nourished. No distress.  HENT:  Head: Atraumatic.  Mouth/Throat: Oropharynx is clear and moist.  Eyes: Conjunctivae are normal. Pupils are equal, round, and reactive to light.  Neck: Normal range of motion. Neck supple. No tracheal deviation present.  Cardiovascular: Normal rate, regular rhythm, normal heart sounds and intact distal pulses.  Pulmonary/Chest: Effort normal and breath sounds normal. No accessory muscle usage. No respiratory distress. He exhibits no tenderness.  Abdominal: Soft. Bowel sounds are normal. He exhibits no distension. There is no tenderness.  No abd contusion or bruising  Genitourinary:  Genitourinary Comments: No cva tenderness  Musculoskeletal: He exhibits no edema.  CTLS spine, non tender, aligned,  no step off. Left shoulder deformity c/w dislocation. Radial pulse 2+. No other focal bony tenderness.   Neurological: He is alert.  Motor intact bil, stre 5/5. sens grossly intact. LUE rad/medu/uln n motor 5/5, sens intact.   Skin: Skin is warm and dry. He is not diaphoretic.  Psychiatric: He has a normal mood and affect.  Nursing note and vitals reviewed.    ED Treatments / Results  Labs (all labs ordered are listed, but only abnormal results are displayed) Labs Reviewed - No data to display  EKG  EKG Interpretation None        Radiology Dg Shoulder Left  Result Date: 01/21/2017 CLINICAL DATA:  Post reduction EXAM: LEFT SHOULDER - 2+ VIEW COMPARISON:  01/21/2017 FINDINGS: Interval reduction of the dislocated left shoulder. No visible fracture. Hill-Sachs deformity cannot be completely excluded based on the pre reduction film. IMPRESSION: Interval reduction. Electronically Signed   By: Rolm Baptise M.D.   On: 01/21/2017 11:34   Dg Shoulder Left  Result Date: 01/21/2017 CLINICAL DATA:  Deformity, fall, possible dislocation EXAM: LEFT SHOULDER - 2+ VIEW COMPARISON:  04/29/2011 FINDINGS: Left shoulder anterior inferior dislocation noted with a chronic Hill-Sachs deformity of the humeral head. No definite acute osseous finding. Bones are osteopenic. AC joint aligned. IMPRESSION: Left shoulder anterior inferior dislocation. Electronically Signed   By: Jerilynn Mages.  Shick M.D.   On: 01/21/2017 10:36    Procedures Reduction of dislocation Date/Time: 01/21/2017 11:39 AM Performed by: Lajean Saver, MD Authorized by: Lajean Saver, MD  Consent: Verbal consent obtained. Risks and benefits: risks, benefits and alternatives were discussed Consent given by: patient Patient identity confirmed: verbally with patient and arm band Time out: Immediately prior to procedure a "time out" was called to verify the correct patient, procedure, equipment, support staff and site/side marked as required.  Sedation: Patient sedated: yes Sedatives: lorazepam Analgesia: hydromorphone Sedation start date/time: 01/21/2017 10:48 AM Sedation end date/time: 01/21/2017 11:41 AM Vitals: Vital signs were monitored during sedation.  Patient tolerance: Patient tolerated the procedure well with no immediate complications Comments: Patients left shoulder dislocation was reduced using external reduction w Cunningham technique.  Shoulder reduced. Radial pulse 2+. Pain controlled.  Recheck, no pain. No numbness/weakness. Rad/med/uln n fxn intact.      (including critical care time)  Medications Ordered in ED Medications  fentaNYL (SUBLIMAZE) injection 50 mcg (50 mcg Intravenous Given 01/21/17 1022)     Initial Impression / Assessment and Plan / ED Course  I have reviewed the triage vital signs and the nursing notes.  Pertinent labs & imaging results that were available during my care of the patient were reviewed by me and considered in my medical decision making (see chart for details).  Xrays.   Iv ns. Fentanyl iv prior to xrays.   Dilaudid .5 mg iv. Ativan .5 mg iv.   Reviewed nursing notes and prior charts for additional history.   Shoulder reduced - see reduction note.   Recheck, pt awake and alert. No resp distress.   Shoulder sling applied.   Radial pulse 2+. Pain controlled.     Final Clinical Impressions(s) / ED Diagnoses   Final diagnoses:  Shoulder joint deformity, left    ED Discharge Orders    None       Lajean Saver, MD 01/21/17 1156

## 2017-01-21 NOTE — ED Notes (Signed)
Given snack with coke. Alert, pleasant and cooperative

## 2017-01-21 NOTE — ED Notes (Signed)
ED Provider at bedside. 

## 2017-09-05 ENCOUNTER — Inpatient Hospital Stay (HOSPITAL_BASED_OUTPATIENT_CLINIC_OR_DEPARTMENT_OTHER)
Admission: EM | Admit: 2017-09-05 | Discharge: 2017-09-19 | DRG: 180 | Disposition: A | Discharge status: Hospice | Payer: Medicare Other | Attending: Internal Medicine | Admitting: Internal Medicine

## 2017-09-05 ENCOUNTER — Encounter (HOSPITAL_BASED_OUTPATIENT_CLINIC_OR_DEPARTMENT_OTHER): Payer: Self-pay | Admitting: *Deleted

## 2017-09-05 ENCOUNTER — Emergency Department (HOSPITAL_BASED_OUTPATIENT_CLINIC_OR_DEPARTMENT_OTHER): Payer: Medicare Other

## 2017-09-05 ENCOUNTER — Other Ambulatory Visit: Payer: Self-pay

## 2017-09-05 DIAGNOSIS — F17210 Nicotine dependence, cigarettes, uncomplicated: Secondary | ICD-10-CM | POA: Diagnosis present

## 2017-09-05 DIAGNOSIS — R042 Hemoptysis: Secondary | ICD-10-CM | POA: Diagnosis present

## 2017-09-05 DIAGNOSIS — J189 Pneumonia, unspecified organism: Secondary | ICD-10-CM | POA: Diagnosis present

## 2017-09-05 DIAGNOSIS — F1010 Alcohol abuse, uncomplicated: Secondary | ICD-10-CM | POA: Diagnosis present

## 2017-09-05 DIAGNOSIS — D638 Anemia in other chronic diseases classified elsewhere: Secondary | ICD-10-CM | POA: Diagnosis present

## 2017-09-05 DIAGNOSIS — D491 Neoplasm of unspecified behavior of respiratory system: Secondary | ICD-10-CM | POA: Diagnosis not present

## 2017-09-05 DIAGNOSIS — R279 Unspecified lack of coordination: Secondary | ICD-10-CM | POA: Diagnosis not present

## 2017-09-05 DIAGNOSIS — Z743 Need for continuous supervision: Secondary | ICD-10-CM | POA: Diagnosis not present

## 2017-09-05 DIAGNOSIS — C3491 Malignant neoplasm of unspecified part of right bronchus or lung: Secondary | ICD-10-CM | POA: Diagnosis not present

## 2017-09-05 DIAGNOSIS — D62 Acute posthemorrhagic anemia: Secondary | ICD-10-CM | POA: Diagnosis present

## 2017-09-05 DIAGNOSIS — Z23 Encounter for immunization: Secondary | ICD-10-CM

## 2017-09-05 DIAGNOSIS — C7801 Secondary malignant neoplasm of right lung: Secondary | ICD-10-CM | POA: Diagnosis present

## 2017-09-05 DIAGNOSIS — I2510 Atherosclerotic heart disease of native coronary artery without angina pectoris: Secondary | ICD-10-CM | POA: Diagnosis present

## 2017-09-05 DIAGNOSIS — M25512 Pain in left shoulder: Secondary | ICD-10-CM | POA: Diagnosis not present

## 2017-09-05 DIAGNOSIS — F17200 Nicotine dependence, unspecified, uncomplicated: Secondary | ICD-10-CM | POA: Diagnosis not present

## 2017-09-05 DIAGNOSIS — Z7952 Long term (current) use of systemic steroids: Secondary | ICD-10-CM | POA: Diagnosis not present

## 2017-09-05 DIAGNOSIS — Z87891 Personal history of nicotine dependence: Secondary | ICD-10-CM | POA: Diagnosis not present

## 2017-09-05 DIAGNOSIS — C779 Secondary and unspecified malignant neoplasm of lymph node, unspecified: Secondary | ICD-10-CM | POA: Diagnosis not present

## 2017-09-05 DIAGNOSIS — D509 Iron deficiency anemia, unspecified: Secondary | ICD-10-CM | POA: Diagnosis present

## 2017-09-05 DIAGNOSIS — C7802 Secondary malignant neoplasm of left lung: Secondary | ICD-10-CM | POA: Diagnosis present

## 2017-09-05 DIAGNOSIS — Z7189 Other specified counseling: Secondary | ICD-10-CM | POA: Diagnosis not present

## 2017-09-05 DIAGNOSIS — R0902 Hypoxemia: Secondary | ICD-10-CM

## 2017-09-05 DIAGNOSIS — R0789 Other chest pain: Secondary | ICD-10-CM | POA: Diagnosis not present

## 2017-09-05 DIAGNOSIS — R0602 Shortness of breath: Secondary | ICD-10-CM

## 2017-09-05 DIAGNOSIS — Z515 Encounter for palliative care: Secondary | ICD-10-CM | POA: Diagnosis present

## 2017-09-05 DIAGNOSIS — E8770 Fluid overload, unspecified: Secondary | ICD-10-CM | POA: Diagnosis present

## 2017-09-05 DIAGNOSIS — R4020 Unspecified coma: Secondary | ICD-10-CM | POA: Diagnosis not present

## 2017-09-05 DIAGNOSIS — Z681 Body mass index (BMI) 19 or less, adult: Secondary | ICD-10-CM | POA: Diagnosis not present

## 2017-09-05 DIAGNOSIS — C7931 Secondary malignant neoplasm of brain: Secondary | ICD-10-CM | POA: Diagnosis present

## 2017-09-05 DIAGNOSIS — J9601 Acute respiratory failure with hypoxia: Secondary | ICD-10-CM | POA: Diagnosis not present

## 2017-09-05 DIAGNOSIS — R64 Cachexia: Secondary | ICD-10-CM | POA: Diagnosis not present

## 2017-09-05 DIAGNOSIS — E440 Moderate protein-calorie malnutrition: Secondary | ICD-10-CM | POA: Diagnosis not present

## 2017-09-05 DIAGNOSIS — I700 Atherosclerosis of aorta: Secondary | ICD-10-CM | POA: Diagnosis not present

## 2017-09-05 DIAGNOSIS — C3412 Malignant neoplasm of upper lobe, left bronchus or lung: Secondary | ICD-10-CM | POA: Diagnosis not present

## 2017-09-05 DIAGNOSIS — R918 Other nonspecific abnormal finding of lung field: Secondary | ICD-10-CM | POA: Diagnosis not present

## 2017-09-05 DIAGNOSIS — E46 Unspecified protein-calorie malnutrition: Secondary | ICD-10-CM | POA: Diagnosis present

## 2017-09-05 DIAGNOSIS — H9190 Unspecified hearing loss, unspecified ear: Secondary | ICD-10-CM | POA: Diagnosis present

## 2017-09-05 DIAGNOSIS — R7989 Other specified abnormal findings of blood chemistry: Secondary | ICD-10-CM | POA: Diagnosis present

## 2017-09-05 DIAGNOSIS — Z66 Do not resuscitate: Secondary | ICD-10-CM | POA: Diagnosis not present

## 2017-09-05 DIAGNOSIS — R079 Chest pain, unspecified: Secondary | ICD-10-CM | POA: Diagnosis present

## 2017-09-05 DIAGNOSIS — I361 Nonrheumatic tricuspid (valve) insufficiency: Secondary | ICD-10-CM | POA: Diagnosis not present

## 2017-09-05 DIAGNOSIS — C787 Secondary malignant neoplasm of liver and intrahepatic bile duct: Secondary | ICD-10-CM | POA: Diagnosis not present

## 2017-09-05 DIAGNOSIS — C3490 Malignant neoplasm of unspecified part of unspecified bronchus or lung: Secondary | ICD-10-CM | POA: Diagnosis not present

## 2017-09-05 DIAGNOSIS — Z831 Family history of other infectious and parasitic diseases: Secondary | ICD-10-CM

## 2017-09-05 DIAGNOSIS — C3492 Malignant neoplasm of unspecified part of left bronchus or lung: Secondary | ICD-10-CM | POA: Diagnosis not present

## 2017-09-05 DIAGNOSIS — R778 Other specified abnormalities of plasma proteins: Secondary | ICD-10-CM | POA: Diagnosis present

## 2017-09-05 DIAGNOSIS — R748 Abnormal levels of other serum enzymes: Secondary | ICD-10-CM | POA: Diagnosis not present

## 2017-09-05 HISTORY — DX: Alcohol abuse, uncomplicated: F10.10

## 2017-09-05 HISTORY — DX: Tobacco use: Z72.0

## 2017-09-05 LAB — COMPREHENSIVE METABOLIC PANEL
ALK PHOS: 48 U/L (ref 38–126)
ALT: 10 U/L (ref 0–44)
AST: 23 U/L (ref 15–41)
Albumin: 2.6 g/dL — ABNORMAL LOW (ref 3.5–5.0)
Anion gap: 8 (ref 5–15)
BILIRUBIN TOTAL: 0.7 mg/dL (ref 0.3–1.2)
BUN: 11 mg/dL (ref 8–23)
CHLORIDE: 102 mmol/L (ref 98–111)
CO2: 21 mmol/L — AB (ref 22–32)
Calcium: 8.4 mg/dL — ABNORMAL LOW (ref 8.9–10.3)
Creatinine, Ser: 1.08 mg/dL (ref 0.61–1.24)
GFR calc non Af Amer: >60 mL/min (ref >60)
Glucose, Bld: 85 mg/dL (ref 70–99)
Potassium: 3.9 mmol/L (ref 3.5–5.1)
SODIUM: 131 mmol/L — AB (ref 135–145)
Total Protein: 7.9 g/dL (ref 6.5–8.1)

## 2017-09-05 LAB — CBC WITH DIFFERENTIAL/PLATELET
Basophils Absolute: 0 10*3/uL (ref 0.0–0.1)
Basophils Relative: 0 %
Eosinophils Absolute: 0.1 10*3/uL (ref 0.0–0.7)
Eosinophils Relative: 1 %
HEMATOCRIT: 30.9 % — AB (ref 39.0–52.0)
HEMOGLOBIN: 9.8 g/dL — AB (ref 13.0–17.0)
LYMPHS ABS: 1.2 10*3/uL (ref 0.7–4.0)
LYMPHS PCT: 16 %
MCH: 24.7 pg — AB (ref 26.0–34.0)
MCHC: 31.7 g/dL (ref 30.0–36.0)
MCV: 77.8 fL — AB (ref 78.0–100.0)
MONOS PCT: 6 %
Monocytes Absolute: 0.5 10*3/uL (ref 0.1–1.0)
NEUTROS ABS: 5.5 10*3/uL (ref 1.7–7.7)
NEUTROS PCT: 77 %
Platelets: 292 10*3/uL (ref 150–400)
RBC: 3.97 MIL/uL — AB (ref 4.22–5.81)
RDW: 15.4 % (ref 11.5–15.5)
WBC: 7.2 10*3/uL (ref 4.0–10.5)

## 2017-09-05 LAB — PROTIME-INR
INR: 1.05
Prothrombin Time: 13.7 seconds (ref 11.4–15.2)

## 2017-09-05 LAB — RETICULOCYTES
RBC.: 3.46 MIL/uL — ABNORMAL LOW (ref 4.22–5.81)
RETIC CT PCT: 1.5 % (ref 0.4–3.1)
Retic Count, Absolute: 51.9 10*3/uL (ref 19.0–186.0)

## 2017-09-05 LAB — TROPONIN I: Troponin I: 1.05 ng/mL (ref <0.03)

## 2017-09-05 LAB — APTT: aPTT: 32 seconds (ref 24–36)

## 2017-09-05 LAB — D-DIMER, QUANTITATIVE: D-Dimer, Quant: 3.91 ug/mL-FEU — ABNORMAL HIGH (ref 0.00–0.50)

## 2017-09-05 MED ORDER — PNEUMOCOCCAL VAC POLYVALENT 25 MCG/0.5ML IJ INJ
0.5000 mL | INJECTION | INTRAMUSCULAR | Status: AC
  Administered 2017-09-06: 0.5 mL via INTRAMUSCULAR
  Filled 2017-09-05: qty 0.5

## 2017-09-05 MED ORDER — ONDANSETRON HCL 4 MG PO TABS: 4.0000 mg | ORAL_TABLET | Freq: Four times a day (QID) | ORAL | Status: DC | PRN

## 2017-09-05 MED ORDER — IOPAMIDOL (ISOVUE-370) INJECTION 76%
100.0000 mL | Freq: Once | INTRAVENOUS | Status: AC | PRN
  Administered 2017-09-05: 100 mL via INTRAVENOUS

## 2017-09-05 MED ORDER — ONDANSETRON HCL 4 MG/2ML IJ SOLN
4.0000 mg | Freq: Four times a day (QID) | INTRAMUSCULAR | Status: DC | PRN
Start: 2017-09-05 — End: 2017-09-19

## 2017-09-05 MED ORDER — MORPHINE SULFATE (PF) 4 MG/ML IV SOLN
4.0000 mg | Freq: Once | INTRAVENOUS | Status: AC
  Administered 2017-09-05: 4 mg via INTRAVENOUS
  Filled 2017-09-05: qty 1

## 2017-09-05 MED ORDER — ACETAMINOPHEN 650 MG RE SUPP: 650.0000 mg | Freq: Four times a day (QID) | RECTAL | Status: DC | PRN

## 2017-09-05 MED ORDER — ZOLPIDEM TARTRATE 5 MG PO TABS
5.0000 mg | ORAL_TABLET | Freq: Every evening | ORAL | Status: DC | PRN
  Administered 2017-09-06 – 2017-09-15 (×8): 5 mg via ORAL
  Filled 2017-09-05 (×9): qty 1

## 2017-09-05 MED ORDER — ONDANSETRON HCL 4 MG/2ML IJ SOLN
4.0000 mg | Freq: Once | INTRAMUSCULAR | Status: AC
  Administered 2017-09-05: 4 mg via INTRAVENOUS
  Filled 2017-09-05: qty 2

## 2017-09-05 MED ORDER — ACETAMINOPHEN 325 MG PO TABS
650.0000 mg | ORAL_TABLET | Freq: Four times a day (QID) | ORAL | Status: DC | PRN
  Administered 2017-09-06 – 2017-09-15 (×7): 650 mg via ORAL
  Filled 2017-09-05 (×7): qty 2

## 2017-09-05 NOTE — H&P (Signed)
History and Physical    GEROD CALIGIURI VXB:[id number] DOB: [birth year] DOA: 09/05/2017  PCP: Golden Circle, FNP  Patient coming from: Home.  Chief Complaint: Chest pain.  HPI: Corey Dorsey is a 77 y.o. male with no significant past medical history and has not been to a doctor for many years started experiencing chest pain in the left anterior chest wall for the last few weeks.  Chest pain is present even at rest and increased on deep inspiration.  Has been examined productive cough sometimes blood-tinged mostly in the mornings.  Has been doing some weight loss last few weeks.  Has benign poor appetite.  ED Course: In the ER CT chest shows lung mass with possible metastasis concerning for bronchogenic carcinoma.  Troponin was mildly elevated and EKG shows normal sinus rhythm.  ER physician discussed with on-call oncologist who advised patient will need biopsy and pulmonary consult.  Review of Systems: As per HPI, rest all negative.   Past Medical History:  Diagnosis Date  . Hearing loss     Past Surgical History:  Procedure Laterality Date  . ABDOMINAL SURGERY       reports that he has been smoking cigarettes.  He has a 8.70 pack-year smoking history. He has never used smokeless tobacco. He reports that he drinks about 1.2 oz of alcohol per week. He reports that he does not use drugs.  No Known Allergies  Family History  Problem Relation Age of Onset  . Tuberculosis Father     Prior to Admission medications   Medication Sig Start Date End Date Taking? Authorizing Provider  acetaminophen (TYLENOL) 500 MG tablet Take 500 mg by mouth daily as needed for mild pain.   Yes [provider]  naproxen sodium (ALEVE) 220 MG tablet Take 220 mg by mouth daily as needed (pain).   Yes [provider]    Physical Exam: Vitals:   09/05/17 1700 09/05/17 1730 09/05/17 1846 09/05/17 2109  BP: 121/78 116/73 116/79 118/86  Pulse: 91 72 87 92  Resp: 20 16 20 20    Temp:   98.2 F (36.8 C) 98.2 F (36.8 C)  TempSrc:   Oral Oral  SpO2: (!) 87% 95% 94% 97%  Height:          Constitutional: Moderately built and nourished. Vitals:   09/05/17 1700 09/05/17 1730 09/05/17 1846 09/05/17 2109  BP: 121/78 116/73 116/79 118/86  Pulse: 91 72 87 92  Resp: 20 16 20 20   Temp:   98.2 F (36.8 C) 98.2 F (36.8 C)  TempSrc:   Oral Oral  SpO2: (!) 87% 95% 94% 97%  Height:       Eyes: Anicteric no pallor. ENMT: No discharge from the ears eyes nose or mouth. Neck: No mass felt.  No neck rigidity. Respiratory: No rhonchi or crepitations. Cardiovascular: S1-S2 heard no murmurs appreciated. Abdomen: Soft nontender bowel sounds present. Musculoskeletal: No edema.  No joint effusion. Skin: No rash. Neurologic: Alert awake oriented to time place and person.  Moves all extremities. Psychiatric: Appears normal per normal affect.   Labs on Admission: I have personally reviewed following labs and imaging studies  CBC: Recent Labs  Lab 09/05/17 1429  WBC 7.2  NEUTROABS 5.5  HGB 9.8*  HCT 30.9*  MCV 77.8*  PLT 330   Basic Metabolic Panel: Recent Labs  Lab 09/05/17 1429  NA 131*  K 3.9  CL 102  CO2 21*  GLUCOSE 85  BUN 11  CREATININE 1.08  CALCIUM 8.4*   GFR: CrCl cannot be calculated (Unknown ideal weight.). Liver Function Tests: Recent Labs  Lab 09/05/17 1429  AST 23  ALT 10  ALKPHOS 48  BILITOT 0.7  PROT 7.9  ALBUMIN 2.6*   No results for input(s): LIPASE, AMYLASE in the last 168 hours. No results for input(s): AMMONIA in the last 168 hours. Coagulation Profile: Recent Labs  Lab 09/05/17 1708  INR 1.05   Cardiac Enzymes: Recent Labs  Lab 09/05/17 1429  TROPONINI 1.05*   BNP (last 3 results) No results for input(s): PROBNP in the last 8760 hours. HbA1C: No results for input(s): HGBA1C in the last 72 hours. CBG: No results for input(s): GLUCAP in the last 168 hours. Lipid Profile: No results for input(s): CHOL,  HDL, LDLCALC, TRIG, CHOLHDL, LDLDIRECT in the last 72 hours. Thyroid Function Tests: No results for input(s): TSH, T4TOTAL, FREET4, T3FREE, THYROIDAB in the last 72 hours. Anemia Panel: No results for input(s): VITAMINB12, FOLATE, FERRITIN, TIBC, IRON, RETICCTPCT in the last 72 hours. Urine analysis:    Component Value Date/Time   COLORURINE YELLOW 02/11/2008 [place] 02/11/2008 1139   LABSPEC 1.022 02/11/2008 1139   PHURINE 5.5 02/11/2008 1139   GLUCOSEU NEGATIVE 02/11/2008 1139   HGBUR TRACE (A) 02/11/2008 1139   BILIRUBINUR NEGATIVE 02/11/2008 1139   KETONESUR NEGATIVE 02/11/2008 1139   PROTEINUR NEGATIVE 02/11/2008 1139   UROBILINOGEN 0.2 02/11/2008 1139   NITRITE NEGATIVE 02/11/2008 1139   LEUKOCYTESUR MODERATE (A) 02/11/2008 1139   Sepsis Labs: @LABRCNTIP (procalcitonin:4,lacticidven:4) )No results found for this or any previous visit (from the past 240 hour(s)).   Radiological Exams on Admission: Dg Chest 2 View  Result Date: 09/05/2017 CLINICAL DATA:  Hemoptysis.  History of smoking. EXAM: CHEST - 2 VIEW COMPARISON:  Prior report 11/09/1998. FINDINGS: Multiple bilateral pulmonary mass lesions and infiltrates are present. Findings are suspicious for metastatic disease with possible superimposed pneumonia. Small bilateral pleural effusions, left side greater than right. Elevation left hemidiaphragm. No pneumothorax. Heart size normal. No acute bony abnormality. IMPRESSION: Multiple bilateral pulmonary mass lesions infiltrates are present. Findings suspicious for metastatic disease with possible superimposed pneumonia. Small bilateral pleural effusions. Contrast-enhanced chest CT should be considered for further evaluation. Electronically Signed   By: Marcello Moores  Register   On: 09/05/2017 15:03   Ct Angio Chest Pe W And/or Wo Contrast  Result Date: 09/05/2017 CLINICAL DATA:  Intermittent chest pain for 2 months. Hemoptysis. Decreased appetite. History of smoking.  EXAM: CT ANGIOGRAPHY CHEST WITH CONTRAST TECHNIQUE: Multidetector CT imaging of the chest was performed using the standard protocol during bolus administration of intravenous contrast. Multiplanar CT image reconstructions and MIPs were obtained to evaluate the vascular anatomy. CONTRAST:  173mL ISOVUE-370 IOPAMIDOL (ISOVUE-370) INJECTION 76% COMPARISON:  Chest radiograph 09/05/2017 FINDINGS: Cardiovascular: Pulmonary arterial opacification is adequate without evidence of emboli. Mild thoracic aortic atherosclerosis is noted without aneurysm. The heart is normal in size. Coronary artery atherosclerosis is noted. There is a trace pericardial effusion. Mediastinum/Nodes: Bilateral gynecomastia. Scattered subcentimeter mediastinal lymph nodes, the largest measuring 9 mm in short axis in the AP window. Enlarged hilar lymph nodes measure up to 14 mm on the left. Lungs/Pleura: A large mass in the lingula measures 10.6 x 8.6 x 9.2 cm (AP x transverse x craniocaudal) and extends to both the lateral and mediastinal pleural surfaces with some adjacent pleural thickening. A trace left pleural effusion versus pleural thickening is noted posteriorly in the left lower hemithorax. There are innumerable pulmonary nodules throughout both  lungs. An anterior right upper lobe mass extending to the minor fissure measures 3.9 x 2.6 cm. Moderate centrilobular and paraseptal emphysema is noted. Areas of ground-glass opacity and interlobular septal thickening are present bilaterally, greatest in the left upper lobe and left lower lobe. Upper Abdomen: Suspected stone in the gallbladder, incompletely imaged. Musculoskeletal: No suspicious lytic or blastic osseous lesion. Mild anterior wedging of the T12 vertebral body, most likely chronic. Review of the MIP images confirms the above findings. IMPRESSION: 1. No evidence of pulmonary emboli. 2. 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 Dominant 10 cm lingular mass may reflect  primary bronchogenic carcinoma. 3. Mildly enlarged hilar lymph nodes, concerning for nodal metastases. 4. Aortic Atherosclerosis (ICD10-I70.0) and Emphysema (ICD10-J43.9). Electronically Signed   By: Logan Bores M.D.   On: 09/05/2017 16:08    EKG: Independently reviewed.  Normal sinus rhythm.  Assessment/Plan Principal Problem:   Lung mass Active Problems:   Shortness of breath   Elevated troponin   Microcytic hypochromic anemia    1. 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  I have discussed with on-call pulmonary critical care will be seeing patient in consult for further management of the lung mass. 2. Chest pain with elevated troponin.  Chest pain appears atypical reproducible.  Given that patient has elevated troponin we will cycle cardiac markers check 2D echo consult cardiology.  Did not place patient on aspirin since patient likely may go for biopsy and also patient has anemia and will be trending CBC. 3. Microcytic hypochromic anemia will check anemia panel follow CBC.  Patient states he has not had any colonoscopy.   DVT prophylaxis: SCDs. Code Status: Full code. Family Communication: Discussed with patient. Disposition Plan: Home. Consults called: Pulmonary critical care and cardiology. Admission status: Inpatient.   Rise Patience MD [facility] Pager [phone].  If 7PM-7AM, please contact night-coverage [email or web address] Password [facility]  09/05/2017, 10:59 PM

## 2017-09-05 NOTE — ED Provider Notes (Signed)
[place] EMERGENCY DEPARTMENT Provider Note   CSN: [id number] Arrival date & time: 09/05/17  1403     History   Chief Complaint Chief Complaint  Patient presents with  . Chest Pain    HPI Corey Dorsey is a 77 y.o. male.  HPI   Corey Dorsey is a 77 y.o. male, with a history of past tobacco use, presenting to the ED with chest pain for more than a week, patient states, "It's been going on for a while, I don't know how long."  Pain is left chest, vague description, moderate to severe, intermittent, sometimes radiating to the right shoulder.  Accompanied by shortness of breath with exertion. Increased pain with deep breathing and cough.  States he has been "coughing up red stuff."  Denies fever/chills, N/V/D, abdominal pain, falls/trauma, diaphoresis, dizziness, lower extremity edema or pain, or any other complaints.    Past Medical History:  Diagnosis Date  . Hearing loss     Patient Active Problem List   Diagnosis Date Noted  . Lung mass 09/05/2017  . Mass of abdomen 11/15/2016  . Elevated LDL cholesterol level 11/15/2016  . Dental abscess 05/09/2015  . Medicare annual wellness visit, subsequent 03/31/2015  . Tobacco use disorder 03/31/2015  . Hearing loss     Past Surgical History:  Procedure Laterality Date  . ABDOMINAL SURGERY          Home Medications    Prior to Admission medications   Not on File    Family History No family history on file.  Social History Social History   Tobacco Use  . Smoking status: Current Some Day Smoker    Packs/day: 0.15    Years: 58.00    Pack years: 8.70    Types: Cigarettes  . Smokeless tobacco: Never Used  Substance Use Topics  . Alcohol use: Yes    Alcohol/week: 1.2 oz    Types: 2 Cans of beer per week  . Drug use: No     Allergies   Patient has no known allergies.   Review of Systems Review of Systems  Constitutional: Negative for chills, diaphoresis and fever.    Respiratory: Positive for cough and shortness of breath.   Cardiovascular: Positive for chest pain. Negative for leg swelling.  Gastrointestinal: Negative for abdominal pain, blood in stool, constipation, diarrhea, nausea and vomiting.  All other systems reviewed and are negative.    Physical Exam Updated Vital Signs BP 100/81   Pulse 81   Temp 98.1 F (36.7 C) (Oral)   Resp (!) 28   Ht 5\' 8"  (1.727 m)   SpO2 95%   BMI 20.07 kg/m   Physical Exam  Constitutional: He appears well-developed and well-nourished. No distress.  HENT:  Head: Normocephalic and atraumatic.  Eyes: Conjunctivae are normal.  Neck: Neck supple.  Cardiovascular: Normal rate, regular rhythm, normal heart sounds and intact distal pulses.  Pulmonary/Chest: Breath sounds normal.  Conversational dyspnea as well as increased work of breathing with changes in position or minor exertion.    Abdominal: Soft. There is no tenderness. There is no guarding.  Musculoskeletal: He exhibits no edema.  Lymphadenopathy:    He has no cervical adenopathy.  Neurological: He is alert.  Skin: Skin is warm and dry. He is not diaphoretic.  Psychiatric: He has a normal mood and affect. His behavior is normal.  Nursing note and vitals reviewed.    ED Treatments / Results  Labs (all labs ordered are listed, but  only abnormal results are displayed) Labs Reviewed  TROPONIN I - Abnormal; Notable for the following components:      Result Value   Troponin I 1.05 (*)    All other components within normal limits  CBC WITH DIFFERENTIAL/PLATELET - Abnormal; Notable for the following components:   RBC 3.97 (*)    Hemoglobin 9.8 (*)    HCT 30.9 (*)    MCV 77.8 (*)    MCH 24.7 (*)    All other components within normal limits  COMPREHENSIVE METABOLIC PANEL - Abnormal; Notable for the following components:   Sodium 131 (*)    CO2 21 (*)    Calcium 8.4 (*)    Albumin 2.6 (*)    All other components within normal limits   D-DIMER, QUANTITATIVE (NOT AT [facility]) - Abnormal; Notable for the following components:   D-Dimer, Quant 3.91 (*)    All other components within normal limits   Hemoglobin  Date Value Ref Range Status  09/05/2017 9.8 (L) 13.0 - 17.0 g/dL Final  11/17/2016 13.6 13.0 - 17.0 g/dL Final  04/03/2015 12.8 (L) 13.0 - 17.0 g/dL Final  09/05/2012 15.6 13.0 - 17.0 g/dL Final   EKG EKG Interpretation  Date/Time:  Tuesday September 05 2017 14:09:16 EDT Ventricular Rate:  82 PR Interval:  156 QRS Duration: 80 QT Interval:  396 QTC Calculation: 462 R Axis:   35 Text Interpretation:  Normal sinus rhythm Normal ECG No significant change since last tracing Confirmed by Deno Etienne [phone]) on 09/05/2017 2:45:58 PM   Radiology Dg Chest 2 View  Result Date: 09/05/2017 CLINICAL DATA:  Hemoptysis.  History of smoking. EXAM: CHEST - 2 VIEW COMPARISON:  Prior report 11/09/1998. FINDINGS: Multiple bilateral pulmonary mass lesions and infiltrates are present. Findings are suspicious for metastatic disease with possible superimposed pneumonia. Small bilateral pleural effusions, left side greater than right. Elevation left hemidiaphragm. No pneumothorax. Heart size normal. No acute bony abnormality. IMPRESSION: Multiple bilateral pulmonary mass lesions infiltrates are present. Findings suspicious for metastatic disease with possible superimposed pneumonia. Small bilateral pleural effusions. Contrast-enhanced chest CT should be considered for further evaluation. Electronically Signed   By: Marcello Moores  Register   On: 09/05/2017 15:03   Ct Angio Chest Pe W And/or Wo Contrast  Result Date: 09/05/2017 CLINICAL DATA:  Intermittent chest pain for 2 months. Hemoptysis. Decreased appetite. History of smoking. EXAM: CT ANGIOGRAPHY CHEST WITH CONTRAST TECHNIQUE: Multidetector CT imaging of the chest was performed using the standard protocol during bolus administration of intravenous contrast. Multiplanar CT image reconstructions and  MIPs were obtained to evaluate the vascular anatomy. CONTRAST:  147mL ISOVUE-370 IOPAMIDOL (ISOVUE-370) INJECTION 76% COMPARISON:  Chest radiograph 09/05/2017 FINDINGS: Cardiovascular: Pulmonary arterial opacification is adequate without evidence of emboli. Mild thoracic aortic atherosclerosis is noted without aneurysm. The heart is normal in size. Coronary artery atherosclerosis is noted. There is a trace pericardial effusion. Mediastinum/Nodes: Bilateral gynecomastia. Scattered subcentimeter mediastinal lymph nodes, the largest measuring 9 mm in short axis in the AP window. Enlarged hilar lymph nodes measure up to 14 mm on the left. Lungs/Pleura: A large mass in the lingula measures 10.6 x 8.6 x 9.2 cm (AP x transverse x craniocaudal) and extends to both the lateral and mediastinal pleural surfaces with some adjacent pleural thickening. A trace left pleural effusion versus pleural thickening is noted posteriorly in the left lower hemithorax. There are innumerable pulmonary nodules throughout both lungs. An anterior right upper lobe mass extending to the minor fissure measures 3.9 x 2.6 cm.  Moderate centrilobular and paraseptal emphysema is noted. Areas of ground-glass opacity and interlobular septal thickening are present bilaterally, greatest in the left upper lobe and left lower lobe. Upper Abdomen: Suspected stone in the gallbladder, incompletely imaged. Musculoskeletal: No suspicious lytic or blastic osseous lesion. Mild anterior wedging of the T12 vertebral body, most likely chronic. Review of the MIP images confirms the above findings. IMPRESSION: 1. No evidence of pulmonary emboli. 2. 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 Dominant 10 cm lingular mass may reflect primary bronchogenic carcinoma. 3. Mildly enlarged hilar lymph nodes, concerning for nodal metastases. 4. Aortic Atherosclerosis (ICD10-I70.0) and Emphysema (ICD10-J43.9). Electronically Signed   By: Logan Bores M.D.   On:  09/05/2017 16:08    Procedures .Critical Care Performed by: Lorayne Bender, PA-C Authorized by: Lorayne Bender, PA-C   Critical care provider statement:    Critical care time (minutes):  35   Critical care time was exclusive of:  Separately billable procedures and treating other patients   Critical care was necessary to treat or prevent imminent or life-threatening deterioration of the following conditions:  Respiratory failure   Critical care was time spent personally by me on the following activities:  Development of treatment plan with patient or surrogate, discussions with consultants, evaluation of patient's response to treatment, examination of patient, obtaining history from patient or surrogate, review of old charts, re-evaluation of patient's condition, pulse oximetry, ordering and review of radiographic studies, ordering and review of laboratory studies and ordering and performing treatments and interventions   I assumed direction of critical care for this patient from another provider in my specialty: no     (including critical care time)  Medications Ordered in ED Medications  iopamidol (ISOVUE-370) 76 % injection 100 mL (100 mLs Intravenous Contrast Given 09/05/17 1525)  morphine 4 MG/ML injection 4 mg (4 mg Intravenous Given 09/05/17 1651)  ondansetron (ZOFRAN) injection 4 mg (4 mg Intravenous Given 09/05/17 1651)     Initial Impression / Assessment and Plan / ED Course  I have reviewed the triage vital signs and the nursing notes.  Pertinent labs & imaging results that were available during my care of the patient were reviewed by me and considered in my medical decision making (see chart for details).  Clinical Course as of Sep 05 1657  Tue Sep 05, 2017  1511 Decreased from 13.6, nine months ago.  Hemoglobin(!): 9.8 [SJ]  1657 Spoke with Dr. Ree Kida, hospitalist at [facility].  Agrees to admit the patient to [facility] telemetry.  The admitting team will contact cardiology  upon patient's arrival.   [SJ]    Clinical Course User Index [SJ] Odai Wimmer C, PA-C    Patient presents with chest pain and shortness of breath.  Found to be hypoxic with room air SPO2 at 89%, requiring 2 L supplemental O2, which made patient much more comfortable and brought his SPO2 up to 95%.  Positive d-dimer and troponin with concern for PE versus lung mass.  Large left lung mass concerning for metastatic disease noted on CT.  These findings were discussed with the patient and questions were answered to the best my ability. Patient transferred to [facility] and admitted for further management.  Findings and plan of care discussed with Deno Etienne, DO. Dr. Tyrone Nine personally evaluated and examined this patient.  Vitals:   09/05/17 1518 09/05/17 1519 09/05/17 1611 09/05/17 1630  BP: 113/86  137/90 124/89  Pulse: 86 74 73 65  Resp:  (!) 22 (!)  24 18  Temp:      TempSrc:      SpO2: 91% (!) 89% 96% 94%  Height:         Final Clinical Impressions(s) / ED Diagnoses   Final diagnoses:  Lung neoplasm  Shortness of breath  Hypoxia    ED Discharge Orders    None       Layla Maw 09/05/17 [place], [place], DO 09/06/17 [phone]

## 2017-09-05 NOTE — ED Notes (Signed)
Pt noted to be coughing up blood tinted sputum.

## 2017-09-05 NOTE — ED Notes (Signed)
Report given to Jar[place], RN for [facility]

## 2017-09-05 NOTE — ED Notes (Signed)
Report given to Judson Roch, RN at [facility]

## 2017-09-05 NOTE — ED Notes (Signed)
Pt resting on stretcher in NAD. Family at bedside.

## 2017-09-05 NOTE — ED Notes (Signed)
Pt SpO2 dropped to 85% after coughing, pt coughed up quarter sized amount of blood. Pt O2 increased to 3L and EDP at bedside to evaluate pt.

## 2017-09-05 NOTE — ED Triage Notes (Signed)
Pain in his left chest for a week. Chest wall pain.

## 2017-09-05 NOTE — ED Notes (Signed)
Pt nephew Myna Bright- [phone] to be contacted if needed.

## 2017-09-05 NOTE — ED Notes (Signed)
[facility] arrived to transport Pt to [facility]

## 2017-09-05 NOTE — Progress Notes (Signed)
Patient Corey Dorsey DOB 03/31/1926  77 year old male who presented to med center [facility] with complaints of chest pain and shortness of breath.  Patient found found to be hypoxic on room air 89% and conversationally dyspneic, requiring 2 L of oxygen.  Chest x-ray noted to mass, CTA chest was done, ruled out PE however patient did have large lung mass with metastatic disease.  Patient was also noted to have an elevated d-dimer as well as a troponin of 1.05. Patient also complained of hemoptysis, which is likely secondary to lung mass. Of note, currently 9.8, however was 13.6 and 2018.  Discussed with Dr. Alvy Bimler, patient will need work-up by pulmonology and a tissue diagnosis before oncology can step been to offer any treatment options.  Holding off on heparin drip for the elevated troponin and chest pain.  Would consult PCCM upon patient's arrival her admission as well as cardiology.  Patient accept to [facility] tele, admit.  Shantay Sonn D.O. [facility] Pager [phone]  If 7PM-7AM, please contact night-coverage [email or web address] Password [email or web address] 09/05/2017, 5:10 PM

## 2017-09-06 ENCOUNTER — Inpatient Hospital Stay (HOSPITAL_COMMUNITY): Payer: Medicare Other

## 2017-09-06 ENCOUNTER — Encounter (HOSPITAL_COMMUNITY): Payer: Self-pay | Admitting: Physician Assistant

## 2017-09-06 DIAGNOSIS — R918 Other nonspecific abnormal finding of lung field: Secondary | ICD-10-CM

## 2017-09-06 DIAGNOSIS — I361 Nonrheumatic tricuspid (valve) insufficiency: Secondary | ICD-10-CM

## 2017-09-06 LAB — IRON AND TIBC
Iron: 29 ug/dL — ABNORMAL LOW (ref 45–182)
Saturation Ratios: 13 % — ABNORMAL LOW (ref 17.9–39.5)
TIBC: 221 ug/dL — ABNORMAL LOW (ref 250–450)
UIBC: 192 ug/dL

## 2017-09-06 LAB — TSH: TSH: 3.464 u[IU]/mL (ref 0.350–4.500)

## 2017-09-06 LAB — CBC
HEMATOCRIT: 26.4 % — AB (ref 39.0–52.0)
Hemoglobin: 8.2 g/dL — ABNORMAL LOW (ref 13.0–17.0)
MCH: 24.7 pg — ABNORMAL LOW (ref 26.0–34.0)
MCHC: 31.1 g/dL (ref 30.0–36.0)
MCV: 79.5 fL (ref 78.0–100.0)
PLATELETS: 267 10*3/uL (ref 150–400)
RBC: 3.32 MIL/uL — ABNORMAL LOW (ref 4.22–5.81)
RDW: 15.7 % — AB (ref 11.5–15.5)
WBC: 6.2 10*3/uL (ref 4.0–10.5)

## 2017-09-06 LAB — TYPE AND SCREEN
ABO/RH(D): O POS
ANTIBODY SCREEN: NEGATIVE

## 2017-09-06 LAB — TROPONIN I
TROPONIN I: 1.04 ng/mL — AB (ref <0.03)
Troponin I: 1.13 ng/mL (ref <0.03)
Troponin I: 0.97 ng/mL (ref <0.03)

## 2017-09-06 LAB — FOLATE: Folate: 6.5 ng/mL (ref >5.9)

## 2017-09-06 LAB — BASIC METABOLIC PANEL
Anion gap: 6 (ref 5–15)
BUN: 12 mg/dL (ref 8–23)
CHLORIDE: 104 mmol/L (ref 98–111)
CO2: 26 mmol/L (ref 22–32)
CREATININE: 1.03 mg/dL (ref 0.61–1.24)
Calcium: 8.4 mg/dL — ABNORMAL LOW (ref 8.9–10.3)
GFR calc Af Amer: >60 mL/min (ref >60)
GFR calc non Af Amer: >60 mL/min (ref >60)
GLUCOSE: 89 mg/dL (ref 70–99)
POTASSIUM: 4.3 mmol/L (ref 3.5–5.1)
Sodium: 136 mmol/L (ref 135–145)

## 2017-09-06 LAB — ABO/RH: ABO/RH(D): O POS

## 2017-09-06 LAB — VITAMIN B12: Vitamin B-12: 399 pg/mL (ref 180–914)

## 2017-09-06 LAB — FERRITIN: FERRITIN: 177 ng/mL (ref 24–336)

## 2017-09-06 LAB — ECHOCARDIOGRAM COMPLETE
HEIGHTINCHES: 68 in
WEIGHTICAEL: 1992.96 [oz_av]

## 2017-09-06 MED ORDER — HEPARIN SODIUM (PORCINE) 5000 UNIT/ML IJ SOLN: 5000.0000 [IU] | Freq: Three times a day (TID) | INTRAMUSCULAR | Status: DC

## 2017-09-06 MED ORDER — ASPIRIN EC 81 MG PO TBEC
81.0000 mg | DELAYED_RELEASE_TABLET | Freq: Every day | ORAL | Status: DC
  Administered 2017-09-06 – 2017-09-10 (×4): 81 mg via ORAL
  Filled 2017-09-06 (×3): qty 1

## 2017-09-06 MED ORDER — HEPARIN SODIUM (PORCINE) 5000 UNIT/ML IJ SOLN
5000.0000 [IU] | Freq: Three times a day (TID) | INTRAMUSCULAR | Status: DC
  Administered 2017-09-07 – 2017-09-09 (×5): 5000 [IU] via SUBCUTANEOUS
  Filled 2017-09-06 (×5): qty 1

## 2017-09-06 NOTE — Progress Notes (Signed)
[facility] PROGRESS NOTE  Corey Dorsey:[id number] DOB: [birth year] DOA: 09/05/2017   PCP: Corey Circle, FNP     Assessment/Plan: Principal Problem:   Lung mass Active Problems:   Shortness of breath   Elevated troponin   Microcytic hypochromic anemia   77 y.o. male with no significant past medical history and has not been to a doctor for many years started experiencing chest pain in the left anterior chest wall for the last few weeks.  Chest pain is present even at rest and increased on deep inspiration.  Has been examined productive cough sometimes blood-tinged mostly in the mornings.  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  Troponin was mildly elevated and EKG shows normal sinus rhythm.   cardiology and pulmonary have been consulted  Assessment and plan 1. 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   discussed with pulmonary critical care, will probably need CT-guided biopsy. Discussed with the patient's niece and son  2. Chest pain with elevated troponin. troponins have been flat 4.further ischemia workup to be determined by cardiology recommendations. 2D echo  .  Did not place patient on aspirin since patient likely may go for biopsy and also patient has anemia and will be trending CBC. 3. Microcytic hypochromic anemia will check anemia panel follow CBC.  Patient states he has not had any colonoscopy.     DVT prophylaxsis  Heparin subcutaneous  Code Status:   Full code   Family Communication: Discussed in detail with the patient, all imaging results, lab results explained to the patient   Disposition Plan:  Pending further workup     Consultants:  Cardiology  Pulmonology  Procedures:  none  Antibiotics: Anti-infectives (From admission, onward)   None         HPI/Subjective: Currently chest pain-free, denies any nausea vomiting abdominal pain shortness of breath  palpitations  Objective: Vitals:   09/05/17 1846 09/05/17 2109 09/06/17 0452 09/06/17 1403  BP: 116/79 118/86 103/69 108/68  Pulse: 87 92 78 76  Resp: 20 20 16 16   Temp: 98.2 F (36.8 C) 98.2 F (36.8 C) 99.1 F (37.3 C) 98.1 F (36.7 C)  TempSrc: Oral Oral Oral Oral  SpO2: 94% 97% 96% 94%  Weight:   56.5 kg (124 lb 9 oz)   Height:        Intake/Output Summary (Last 24 hours) at 09/06/2017 1521 Last data filed at 09/06/2017 1048 Gross per 24 hour  Intake 120 ml  Output -  Net 120 ml    Exam:  Examination:  General exam:  cachectic Respiratory system: Clear to auscultation. Respiratory effort normal. Cardiovascular system: S1 & S2 heard, RRR. No JVD, murmurs, rubs, gallops or clicks. No pedal edema. Gastrointestinal system: Abdomen is nondistended, soft and nontender. No organomegaly or masses felt. Normal bowel sounds heard. Central nervous system: Alert and oriented. No focal neurological deficits. Extremities: Symmetric 5 x 5 power. Skin: No rashes, lesions or ulcers Psychiatry: Judgement and insight appear normal. Mood & affect appropriate.     Data Reviewed: I have personally reviewed following labs and imaging studies  Micro Results No results found for this or any previous visit (from the past 240 hour(s)).  Radiology Reports Dg Chest 2 View  Result Date: 09/05/2017 CLINICAL DATA:  Hemoptysis.  History of smoking. EXAM: CHEST - 2 VIEW COMPARISON:  Prior report 11/09/1998. FINDINGS: Multiple bilateral pulmonary mass lesions and infiltrates are present. Findings  are suspicious for metastatic disease with possible superimposed pneumonia. Small bilateral pleural effusions, left side greater than right. Elevation left hemidiaphragm. No pneumothorax. Heart size normal. No acute bony abnormality. IMPRESSION: Multiple bilateral pulmonary mass lesions infiltrates are present. Findings suspicious for metastatic disease with possible superimposed pneumonia. Small bilateral  pleural effusions. Contrast-enhanced chest CT should be considered for further evaluation. Electronically Signed   By: Marcello Moores  Register   On: 09/05/2017 15:03   Ct Angio Chest Pe W And/or Wo Contrast  Result Date: 09/05/2017 CLINICAL DATA:  Intermittent chest pain for 2 months. Hemoptysis. Decreased appetite. History of smoking. EXAM: CT ANGIOGRAPHY CHEST WITH CONTRAST TECHNIQUE: Multidetector CT imaging of the chest was performed using the standard protocol during bolus administration of intravenous contrast. Multiplanar CT image reconstructions and MIPs were obtained to evaluate the vascular anatomy. CONTRAST:  169mL ISOVUE-370 IOPAMIDOL (ISOVUE-370) INJECTION 76% COMPARISON:  Chest radiograph 09/05/2017 FINDINGS: Cardiovascular: Pulmonary arterial opacification is adequate without evidence of emboli. Mild thoracic aortic atherosclerosis is noted without aneurysm. The heart is normal in size. Coronary artery atherosclerosis is noted. There is a trace pericardial effusion. Mediastinum/Nodes: Bilateral gynecomastia. Scattered subcentimeter mediastinal lymph nodes, the largest measuring 9 mm in short axis in the AP window. Enlarged hilar lymph nodes measure up to 14 mm on the left. Lungs/Pleura: A large mass in the lingula measures 10.6 x 8.6 x 9.2 cm (AP x transverse x craniocaudal) and extends to both the lateral and mediastinal pleural surfaces with some adjacent pleural thickening. A trace left pleural effusion versus pleural thickening is noted posteriorly in the left lower hemithorax. There are innumerable pulmonary nodules throughout both lungs. An anterior right upper lobe mass extending to the minor fissure measures 3.9 x 2.6 cm. Moderate centrilobular and paraseptal emphysema is noted. Areas of ground-glass opacity and interlobular septal thickening are present bilaterally, greatest in the left upper lobe and left lower lobe. Upper Abdomen: Suspected stone in the gallbladder, incompletely imaged.  Musculoskeletal: No suspicious lytic or blastic osseous lesion. Mild anterior wedging of the T12 vertebral body, most likely chronic. Review of the MIP images confirms the above findings. IMPRESSION: 1. No evidence of pulmonary emboli. 2. 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 Dominant 10 cm lingular mass may reflect primary bronchogenic carcinoma. 3. Mildly enlarged hilar lymph nodes, concerning for nodal metastases. 4. Aortic Atherosclerosis (ICD10-I70.0) and Emphysema (ICD10-J43.9). Electronically Signed   By: Logan Bores M.D.   On: 09/05/2017 16:08     CBC Recent Labs  Lab 09/05/17 1429 09/06/17 0459  WBC 7.2 6.2  HGB 9.8* 8.2*  HCT 30.9* 26.4*  PLT 292 267  MCV 77.8* 79.5  MCH 24.7* 24.7*  MCHC 31.7 31.1  RDW 15.4 15.7*  LYMPHSABS 1.2  --   MONOABS 0.5  --   EOSABS 0.1  --   BASOSABS 0.0  --     Chemistries  Recent Labs  Lab 09/05/17 1429 09/06/17 0459  NA 131* 136  K 3.9 4.3  CL 102 104  CO2 21* 26  GLUCOSE 85 89  BUN 11 12  CREATININE 1.08 1.03  CALCIUM 8.4* 8.4*  AST 23  --   ALT 10  --   ALKPHOS 48  --   BILITOT 0.7  --    ------------------------------------------------------------------------------------------------------------------ estimated creatinine clearance is 47.2 mL/min (by C-G formula based on SCr of 1.03 mg/dL). ------------------------------------------------------------------------------------------------------------------ No results for input(s): HGBA1C in the last 72 hours. ------------------------------------------------------------------------------------------------------------------ No results for input(s): CHOL, HDL, LDLCALC, TRIG, CHOLHDL, LDLDIRECT in the last  72 hours. ------------------------------------------------------------------------------------------------------------------ Recent Labs    09/05/17 2312  TSH 3.464    ------------------------------------------------------------------------------------------------------------------ Recent Labs    09/05/17 2312  VITAMINB12 399  FOLATE 6.5  FERRITIN 177  TIBC 221*  IRON 29*  RETICCTPCT 1.5    Coagulation profile Recent Labs  Lab 09/05/17 1708  INR 1.05    Recent Labs    09/05/17 1430  DDIMER 3.91*    Cardiac Enzymes Recent Labs  Lab 09/05/17 2312 09/06/17 0459 09/06/17 1117  TROPONINI 1.13* 1.04* 0.97*   ------------------------------------------------------------------------------------------------------------------ Invalid input(s): POCBNP   CBG: No results for input(s): GLUCAP in the last 168 hours.     Studies: Dg Chest 2 View  Result Date: 09/05/2017 CLINICAL DATA:  Hemoptysis.  History of smoking. EXAM: CHEST - 2 VIEW COMPARISON:  Prior report 11/09/1998. FINDINGS: Multiple bilateral pulmonary mass lesions and infiltrates are present. Findings are suspicious for metastatic disease with possible superimposed pneumonia. Small bilateral pleural effusions, left side greater than right. Elevation left hemidiaphragm. No pneumothorax. Heart size normal. No acute bony abnormality. IMPRESSION: Multiple bilateral pulmonary mass lesions infiltrates are present. Findings suspicious for metastatic disease with possible superimposed pneumonia. Small bilateral pleural effusions. Contrast-enhanced chest CT should be considered for further evaluation. Electronically Signed   By: Marcello Moores  Register   On: 09/05/2017 15:03   Ct Angio Chest Pe W And/or Wo Contrast  Result Date: 09/05/2017 CLINICAL DATA:  Intermittent chest pain for 2 months. Hemoptysis. Decreased appetite. History of smoking. EXAM: CT ANGIOGRAPHY CHEST WITH CONTRAST TECHNIQUE: Multidetector CT imaging of the chest was performed using the standard protocol during bolus administration of intravenous contrast. Multiplanar CT image reconstructions and MIPs were obtained to evaluate  the vascular anatomy. CONTRAST:  127mL ISOVUE-370 IOPAMIDOL (ISOVUE-370) INJECTION 76% COMPARISON:  Chest radiograph 09/05/2017 FINDINGS: Cardiovascular: Pulmonary arterial opacification is adequate without evidence of emboli. Mild thoracic aortic atherosclerosis is noted without aneurysm. The heart is normal in size. Coronary artery atherosclerosis is noted. There is a trace pericardial effusion. Mediastinum/Nodes: Bilateral gynecomastia. Scattered subcentimeter mediastinal lymph nodes, the largest measuring 9 mm in short axis in the AP window. Enlarged hilar lymph nodes measure up to 14 mm on the left. Lungs/Pleura: A large mass in the lingula measures 10.6 x 8.6 x 9.2 cm (AP x transverse x craniocaudal) and extends to both the lateral and mediastinal pleural surfaces with some adjacent pleural thickening. A trace left pleural effusion versus pleural thickening is noted posteriorly in the left lower hemithorax. There are innumerable pulmonary nodules throughout both lungs. An anterior right upper lobe mass extending to the minor fissure measures 3.9 x 2.6 cm. Moderate centrilobular and paraseptal emphysema is noted. Areas of ground-glass opacity and interlobular septal thickening are present bilaterally, greatest in the left upper lobe and left lower lobe. Upper Abdomen: Suspected stone in the gallbladder, incompletely imaged. Musculoskeletal: No suspicious lytic or blastic osseous lesion. Mild anterior wedging of the T12 vertebral body, most likely chronic. Review of the MIP images confirms the above findings. IMPRESSION: 1. No evidence of pulmonary emboli. 2. 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 Dominant 10 cm lingular mass may reflect primary bronchogenic carcinoma. 3. Mildly enlarged hilar lymph nodes, concerning for nodal metastases. 4. Aortic Atherosclerosis (ICD10-I70.0) and Emphysema (ICD10-J43.9). Electronically Signed   By: Logan Bores M.D.   On: 09/05/2017 16:08      Lab  Results  Component Value Date   HGBA1C 5.2 06/19/2012   Lab Results  Component Value Date   LDLCALC 113 (H) 11/17/2016  CREATININE 1.03 09/06/2017       Scheduled Meds: Continuous Infusions:   LOS: 1 day    Time spent: >30 MINS    Reyne Dumas  [facility] Pager [phone]. If 7PM-7AM, please contact night-coverage at [email or web address], password [facility] 09/06/2017, 3:21 PM  LOS: 1 day

## 2017-09-06 NOTE — Care Management Note (Signed)
Case Management Note  Patient Details  Name: Corey Dorsey MRN: [id number] Date of Birth: 02-Jan-1941  Subjective/Objective: Spoke to patient  In rm with niece Corey Dorsey per patient agreementsays she is HCPOA asked her to bring in copy.She agreed.Patient has a pcp.Noted on 02 will monitor.             Action/Plan:dc home   Expected Discharge Date:                  Expected Discharge Plan:  Home/Self Care  In-House Referral:     Discharge planning Services  CM Consult  Post Acute Care Choice:    Choice offered to:     DME Arranged:    DME Agency:     HH Arranged:    HH Agency:     Status of Service:  In process, will continue to follow  If discussed at Long Length of Stay Meetings, dates discussed:    Additional Comments:  Dessa Phi, RN 09/06/2017, 2:39 PM

## 2017-09-06 NOTE — Consult Note (Signed)
Name: Corey Dorsey MRN: [id number] DOB: [birth year]    ADMISSION DATE:  09/05/2017 CONSULTATION DATE:  7/17   REFERRING MD :  Allyson Sabal  CHIEF COMPLAINT:  Abnormal CT chest  BRIEF PATIENT DESCRIPTION:  77 year old male smoker with limited medical history due to minimal visits to healthcare providers.  Presents to the hospital on 7/16 with few week history of pleuritic type chest discomfort.  Noted the chest pain to be primarily the anterior chest wall, associated primarily with deep inspiration but also more recently present with rest.  He said intermittent blood-tinged sputum production, weight loss over the last few weeks and decreased appetite.  In the emergency room he was afebrile,Minute his white blood cell count was normal, his pulse oximetry on room air was 87%.  As part of his ER evaluation a CT of chest was obtained, this demonstrated numerous bilateral lung nodules with a dominant 10 cm lingular mass on the left side, also mildly enlarged hilar lymphadenopathy.  All recent concern for cancer.  Pulmonary has been asked to see in evaluation for concern about abnormal CT findings and to help with diagnostics.  SIGNIFICANT EVENTS    STUDIES:  CT chest obtained 7/16: Negative for pulmonary emboli.  Numerous bilateral lung nodules consistent with metastatic disease.  There is a large dominant 10 cm lingular mass on the left.  There is diffuse hilar adenopathy.   HISTORY OF PRESENT ILLNESS: See above  PAST MEDICAL HISTORY :   has a past medical history of Alcohol abuse, Hearing loss, and Tobacco abuse.  has a past surgical history that includes Abdominal surgery. Prior to Admission medications   Medication Sig Start Date End Date Taking? Authorizing Provider  acetaminophen (TYLENOL) 500 MG tablet Take 500 mg by mouth daily as needed for mild pain.   Yes [provider]  naproxen sodium (ALEVE) 220 MG tablet Take 220 mg by mouth daily as needed (pain).   Yes [provider]   No Known Allergies  FAMILY HISTORY:  family history includes Tuberculosis in his father. SOCIAL HISTORY:  reports that he has been smoking cigarettes.  He has a 8.70 pack-year smoking history. He has never used smokeless tobacco. He reports that he drinks about 1.2 oz of alcohol per week. He reports that he does not use drugs.  REVIEW OF SYSTEMS:   Constitutional: Negative for fever, chills, weight loss, malaise/fatigue and diaphoresis.  HENT: Negative for hearing loss, ear pain, nosebleeds, congestion, sore throat, neck pain, tinnitus and ear discharge.   Eyes: Negative for blurred vision, double vision, photophobia, pain, discharge and redness.  Respiratory: + hemoptysis, sputum production, shortness of breath, wheezing and stridor.  + Does have anterior chest wall pain with deep breath or cough Cardiovascular: Negative for chest pain, palpitations, orthopnea, claudication, leg swelling and PND.  Gastrointestinal: Negative for heartburn, nausea, vomiting, abdominal pain, diarrhea, constipation, blood in stool and melena.  Genitourinary: Negative for dysuria, urgency, frequency, hematuria and flank pain.  Musculoskeletal: Negative for myalgias, back pain, joint pain and falls.  Skin: Negative for itching and rash.  Neurological: Negative for dizziness, tingling, tremors, sensory change, speech change, focal weakness, seizures, loss of consciousness, weakness and headaches.  Endo/Heme/Allergies: Negative for environmental allergies and polydipsia. Does not bruise/bleed easily.  SUBJECTIVE:  No acute distress VITAL SIGNS: Temp:  [98.1 F (36.7 C)-99.1 F (37.3 C)] 98.1 F (36.7 C) (07/17 1403) Pulse Rate:  [65-92] 76 (07/17 1403) Resp:  [16-24] 16 (07/17 1403) BP: (103-137)/(68-90) 108/68 (07/17 1403)  SpO2:  [87 %-97 %] 94 % (07/17 1403) Weight:  [124 lb 9 oz (56.5 kg)] 124 lb 9 oz (56.5 kg) (07/17 0452)  PHYSICAL EXAMINATION: General: Frail 77 year old  African-American male currently resting in bed with no acute distress Neuro: Awake oriented no focal deficits HEENT: Normal cephalic atraumatic does have temporal wasting mucous membranes are moist Cardiovascular: Rhythm Lungs: Clear to auscultation Abdomen: Soft nontender Musculoskeletal: Equal strength and bulk Skin: Warm and dry  Recent Labs  Lab 09/05/17 1429 09/06/17 0459  NA 131* 136  K 3.9 4.3  CL 102 104  CO2 21* 26  BUN 11 12  CREATININE 1.08 1.03  GLUCOSE 85 89   Recent Labs  Lab 09/05/17 1429 09/06/17 0459  HGB 9.8* 8.2*  HCT 30.9* 26.4*  WBC 7.2 6.2  PLT 292 267   Dg Chest 2 View  Result Date: 09/05/2017 CLINICAL DATA:  Hemoptysis.  History of smoking. EXAM: CHEST - 2 VIEW COMPARISON:  Prior report 11/09/1998. FINDINGS: Multiple bilateral pulmonary mass lesions and infiltrates are present. Findings are suspicious for metastatic disease with possible superimposed pneumonia. Small bilateral pleural effusions, left side greater than right. Elevation left hemidiaphragm. No pneumothorax. Heart size normal. No acute bony abnormality. IMPRESSION: Multiple bilateral pulmonary mass lesions infiltrates are present. Findings suspicious for metastatic disease with possible superimposed pneumonia. Small bilateral pleural effusions. Contrast-enhanced chest CT should be considered for further evaluation. Electronically Signed   By: Marcello Moores  Register   On: 09/05/2017 15:03   Ct Angio Chest Pe W And/or Wo Contrast  Result Date: 09/05/2017 CLINICAL DATA:  Intermittent chest pain for 2 months. Hemoptysis. Decreased appetite. History of smoking. EXAM: CT ANGIOGRAPHY CHEST WITH CONTRAST TECHNIQUE: Multidetector CT imaging of the chest was performed using the standard protocol during bolus administration of intravenous contrast. Multiplanar CT image reconstructions and MIPs were obtained to evaluate the vascular anatomy. CONTRAST:  149mL ISOVUE-370 IOPAMIDOL (ISOVUE-370) INJECTION 76%  COMPARISON:  Chest radiograph 09/05/2017 FINDINGS: Cardiovascular: Pulmonary arterial opacification is adequate without evidence of emboli. Mild thoracic aortic atherosclerosis is noted without aneurysm. The heart is normal in size. Coronary artery atherosclerosis is noted. There is a trace pericardial effusion. Mediastinum/Nodes: Bilateral gynecomastia. Scattered subcentimeter mediastinal lymph nodes, the largest measuring 9 mm in short axis in the AP window. Enlarged hilar lymph nodes measure up to 14 mm on the left. Lungs/Pleura: A large mass in the lingula measures 10.6 x 8.6 x 9.2 cm (AP x transverse x craniocaudal) and extends to both the lateral and mediastinal pleural surfaces with some adjacent pleural thickening. A trace left pleural effusion versus pleural thickening is noted posteriorly in the left lower hemithorax. There are innumerable pulmonary nodules throughout both lungs. An anterior right upper lobe mass extending to the minor fissure measures 3.9 x 2.6 cm. Moderate centrilobular and paraseptal emphysema is noted. Areas of ground-glass opacity and interlobular septal thickening are present bilaterally, greatest in the left upper lobe and left lower lobe. Upper Abdomen: Suspected stone in the gallbladder, incompletely imaged. Musculoskeletal: No suspicious lytic or blastic osseous lesion. Mild anterior wedging of the T12 vertebral body, most likely chronic. Review of the MIP images confirms the above findings. IMPRESSION: 1. No evidence of pulmonary emboli. 2. 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 Dominant 10 cm lingular mass may reflect primary bronchogenic carcinoma. 3. Mildly enlarged hilar lymph nodes, concerning for nodal metastases. 4. Aortic Atherosclerosis (ICD10-I70.0) and Emphysema (ICD10-J43.9). Electronically Signed   By: Logan Bores M.D.   On: 09/05/2017 16:08  ASSESSMENT / PLAN:  Pulmonary Nodules/Lung mass Microcytic anemia ETOH abuse Tobacco abuse     Numerous pulmonary nodules with dominant left lingular lung mass measuring approximately 10 cm in size -High level of suspicion for cancer -Looks amenable to CT-guided biopsy Plan/rec Will order CT guided lung bx Alternatively could obtain tissue diagnosis via bronchoscopy but would require heavier sedation   Erick Colace ACNP-BC [place] Pager # [phone] OR # [phone] if no answer    09/06/2017, 2:26 PM

## 2017-09-06 NOTE — Progress Notes (Signed)
  Echocardiogram 2D Echocardiogram has been performed.  Corey Dorsey F 09/06/2017, 1:21 PM
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
## 2017-09-06 NOTE — Progress Notes (Signed)
CRITICAL VALUE ALERT  Critical Value: Troponin 1.13  Date & Time Notied:  09/06/2017 0005  Provider Notified: Enrigue Catena  Orders Received/Actions taken: order given to obtain EKG. Will continue to monitor.

## 2017-09-07 ENCOUNTER — Inpatient Hospital Stay (HOSPITAL_COMMUNITY): Payer: Medicare Other

## 2017-09-07 ENCOUNTER — Encounter (HOSPITAL_COMMUNITY): Payer: Self-pay | Admitting: *Deleted

## 2017-09-07 LAB — BASIC METABOLIC PANEL
Anion gap: 8 (ref 5–15)
BUN: 15 mg/dL (ref 8–23)
CHLORIDE: 102 mmol/L (ref 98–111)
CO2: 23 mmol/L (ref 22–32)
CREATININE: 0.99 mg/dL (ref 0.61–1.24)
Calcium: 8.5 mg/dL — ABNORMAL LOW (ref 8.9–10.3)
GFR calc Af Amer: >60 mL/min (ref >60)
GFR calc non Af Amer: >60 mL/min (ref >60)
Glucose, Bld: 76 mg/dL (ref 70–99)
Potassium: 4.5 mmol/L (ref 3.5–5.1)
Sodium: 133 mmol/L — ABNORMAL LOW (ref 135–145)

## 2017-09-07 MED ORDER — MIDAZOLAM HCL 2 MG/2ML IJ SOLN
INTRAMUSCULAR | Status: AC
  Filled 2017-09-07: qty 4

## 2017-09-07 MED ORDER — NALOXONE HCL 0.4 MG/ML IJ SOLN
INTRAMUSCULAR | Status: AC
  Filled 2017-09-07: qty 1

## 2017-09-07 MED ORDER — MIDAZOLAM HCL 2 MG/2ML IJ SOLN
INTRAMUSCULAR | Status: AC | PRN
  Administered 2017-09-07 (×3): 0.5 mg via INTRAVENOUS

## 2017-09-07 MED ORDER — LIDOCAINE HCL 1 % IJ SOLN
INTRAMUSCULAR | Status: AC | PRN
  Administered 2017-09-07: 10 mL

## 2017-09-07 MED ORDER — FLUMAZENIL 0.5 MG/5ML IV SOLN
INTRAVENOUS | Status: AC
  Filled 2017-09-07: qty 5

## 2017-09-07 MED ORDER — FENTANYL CITRATE (PF) 100 MCG/2ML IJ SOLN
INTRAMUSCULAR | Status: AC
  Filled 2017-09-07: qty 4

## 2017-09-07 MED ORDER — LEVOFLOXACIN 750 MG PO TABS: 750.0000 mg | ORAL_TABLET | Freq: Every day | ORAL | Status: DC

## 2017-09-07 MED ORDER — OXYCODONE HCL 5 MG PO TABS
5.0000 mg | ORAL_TABLET | ORAL | Status: DC | PRN
  Administered 2017-09-07 – 2017-09-19 (×28): 5 mg via ORAL
  Filled 2017-09-07 (×29): qty 1

## 2017-09-07 MED ORDER — FENTANYL CITRATE (PF) 100 MCG/2ML IJ SOLN
INTRAMUSCULAR | Status: AC | PRN
  Administered 2017-09-07 (×2): 25 ug via INTRAVENOUS
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
## 2017-09-07 NOTE — Procedures (Signed)
Interventional Radiology Procedure Note  Procedure: CT guided biopsy of LUL lung mass  Complications: None  Estimated Blood Loss: < 10 mL  Findings: 18 G core biopsy x 3 via 17 G needle of LUL/lingular lung mass.  Venetia Night. Kathlene Cote, M.D Pager:  [phone]

## 2017-09-07 NOTE — Progress Notes (Signed)
For CT guided bx today We will s/o Call if CT bx non-diagnostic and FOB needed.    Erick Colace ACNP-BC [place] Pager # [phone] OR # [phone] if no answer

## 2017-09-07 NOTE — Progress Notes (Signed)
Chief Complaint: Patient was seen in consultation today for left lung mass at the request of Marni Griffon, NP  Referring Physician(s): Marni Griffon, NP  Supervising Physician: Aletta Edouard  Patient Status: [facility] - In-pt  History of Present Illness: Corey Dorsey is a 77 y.o. male admitted with weight loss, hemoptysis, and a large left lung mass. After consultation by PCCM, IR is asked to perform CT guided biopsy for tissue diagnosis. PMHx, meds, labs, imaging, allergies reviewed. Has been NPO today as directed.    Past Medical History:  Diagnosis Date  . Alcohol abuse   . Hearing loss   . Tobacco abuse     Past Surgical History:  Procedure Laterality Date  . ABDOMINAL SURGERY      Allergies: Patient has no known allergies.  Medications:  Current Facility-Administered Medications:  .  acetaminophen (TYLENOL) tablet 650 mg, 650 mg, Oral, Q6H PRN, 650 mg at 09/06/17 2310 **OR** acetaminophen (TYLENOL) suppository 650 mg, 650 mg, Rectal, Q6H PRN, Rise Patience, MD .  aspirin EC tablet 81 mg, 81 mg, Oral, Daily, Fay Records, MD, Stopped at 09/07/17 1013 .  heparin injection 5,000 Units, 5,000 Units, Subcutaneous, Q8H, Ardis Rowan, PA-C .  ondansetron [facility]) tablet 4 mg, 4 mg, Oral, Q6H PRN **OR** ondansetron (ZOFRAN) injection 4 mg, 4 mg, Intravenous, Q6H PRN, Rise Patience, MD .  oxyCODONE (Oxy IR/ROXICODONE) immediate release tablet 5 mg, 5 mg, Oral, Q4H PRN, Reyne Dumas, MD, 5 mg at 09/07/17 0754 .  zolpidem (AMBIEN) tablet 5 mg, 5 mg, Oral, QHS PRN, Gardiner Barefoot, NP, 5 mg at 09/06/17 2055    Family History  Problem Relation Age of Onset  . Tuberculosis Father     Social History   Socioeconomic History  . Marital status: Single    Spouse name: Not on file  . Number of children: 2  . Years of education: 25  . Highest education level: Not on file  Occupational History  . Occupation: Retired  Scientific laboratory technician  .  Financial resource strain: Not on file  . Food insecurity:    Worry: Not on file    Inability: Not on file  . Transportation needs:    Medical: Not on file    Non-medical: Not on file  Tobacco Use  . Smoking status: Current Some Day Smoker    Packs/day: 0.15    Years: 58.00    Pack years: 8.70    Types: Cigarettes  . Smokeless tobacco: Never Used  Substance and Sexual Activity  . Alcohol use: Yes    Alcohol/week: 1.2 oz    Types: 2 Cans of beer per week  . Drug use: No  . Sexual activity: Not on file  Lifestyle  . Physical activity:    Days per week: Not on file    Minutes per session: Not on file  . Stress: Not on file  Relationships  . Social connections:    Talks on phone: Not on file    Gets together: Not on file    Attends religious service: Not on file    Active member of club or organization: Not on file    Attends meetings of clubs or organizations: Not on file    Relationship status: Not on file  Other Topics Concern  . Not on file  Social History Narrative   Fun: Walks regularly, likes sports, fishing    Review of Systems: A 12 point ROS discussed and pertinent positives are indicated  in the HPI above.  All other systems are negative.  Review of Systems  Vital Signs: BP 126/78 (BP Location: Right Arm)   Pulse 86   Temp 98.7 F (37.1 C) (Oral)   Resp 20   Ht 5\' 8"  (1.727 m)   Wt 123 lb 14.4 oz (56.2 kg)   SpO2 93%   BMI 18.84 kg/m   Physical Exam  Constitutional: He is oriented to person, place, and time. He appears well-developed. No distress.  HENT:  Head: Normocephalic.  Mouth/Throat: Oropharynx is clear and moist.  Neck: Normal range of motion. No JVD present. No tracheal deviation present.  Cardiovascular: Normal rate, regular rhythm and normal heart sounds.  Pulmonary/Chest: Effort normal. No respiratory distress.  Coarse left BS  Neurological: He is alert and oriented to person, place, and time.  Skin: Skin is warm and dry.    Psychiatric: He has a normal mood and affect.      Imaging: Dg Chest 2 View  Result Date: 09/05/2017 CLINICAL DATA:  Hemoptysis.  History of smoking. EXAM: CHEST - 2 VIEW COMPARISON:  Prior report 11/09/1998. FINDINGS: Multiple bilateral pulmonary mass lesions and infiltrates are present. Findings are suspicious for metastatic disease with possible superimposed pneumonia. Small bilateral pleural effusions, left side greater than right. Elevation left hemidiaphragm. No pneumothorax. Heart size normal. No acute bony abnormality. IMPRESSION: Multiple bilateral pulmonary mass lesions infiltrates are present. Findings suspicious for metastatic disease with possible superimposed pneumonia. Small bilateral pleural effusions. Contrast-enhanced chest CT should be considered for further evaluation. Electronically Signed   By: Marcello Moores  Register   On: 09/05/2017 15:03   Ct Angio Chest Pe W And/or Wo Contrast  Result Date: 09/05/2017 CLINICAL DATA:  Intermittent chest pain for 2 months. Hemoptysis. Decreased appetite. History of smoking. EXAM: CT ANGIOGRAPHY CHEST WITH CONTRAST TECHNIQUE: Multidetector CT imaging of the chest was performed using the standard protocol during bolus administration of intravenous contrast. Multiplanar CT image reconstructions and MIPs were obtained to evaluate the vascular anatomy. CONTRAST:  181mL ISOVUE-370 IOPAMIDOL (ISOVUE-370) INJECTION 76% COMPARISON:  Chest radiograph 09/05/2017 FINDINGS: Cardiovascular: Pulmonary arterial opacification is adequate without evidence of emboli. Mild thoracic aortic atherosclerosis is noted without aneurysm. The heart is normal in size. Coronary artery atherosclerosis is noted. There is a trace pericardial effusion. Mediastinum/Nodes: Bilateral gynecomastia. Scattered subcentimeter mediastinal lymph nodes, the largest measuring 9 mm in short axis in the AP window. Enlarged hilar lymph nodes measure up to 14 mm on the left. Lungs/Pleura: A large mass  in the lingula measures 10.6 x 8.6 x 9.2 cm (AP x transverse x craniocaudal) and extends to both the lateral and mediastinal pleural surfaces with some adjacent pleural thickening. A trace left pleural effusion versus pleural thickening is noted posteriorly in the left lower hemithorax. There are innumerable pulmonary nodules throughout both lungs. An anterior right upper lobe mass extending to the minor fissure measures 3.9 x 2.6 cm. Moderate centrilobular and paraseptal emphysema is noted. Areas of ground-glass opacity and interlobular septal thickening are present bilaterally, greatest in the left upper lobe and left lower lobe. Upper Abdomen: Suspected stone in the gallbladder, incompletely imaged. Musculoskeletal: No suspicious lytic or blastic osseous lesion. Mild anterior wedging of the T12 vertebral body, most likely chronic. Review of the MIP images confirms the above findings. IMPRESSION: 1. No evidence of pulmonary emboli. 2. 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 Dominant 10 cm lingular mass may reflect primary bronchogenic carcinoma. 3. Mildly enlarged hilar lymph nodes, concerning for nodal metastases. 4.  Aortic Atherosclerosis (ICD10-I70.0) and Emphysema (ICD10-J43.9). Electronically Signed   By: Logan Bores M.D.   On: 09/05/2017 16:08    Labs:  CBC: Recent Labs    11/17/16 0923 09/05/17 1429 09/06/17 0459  WBC 4.9 7.2 6.2  HGB 13.6 9.8* 8.2*  HCT 42.4 30.9* 26.4*  PLT 147.0* 292 267    COAGS: Recent Labs    09/05/17 1708  INR 1.05  APTT 32    BMP: Recent Labs    11/17/16 0923 09/05/17 1429 09/06/17 0459 09/07/17 0526  NA 139 131* 136 133*  K 3.8 3.9 4.3 4.5  CL 104 102 104 102  CO2 27 21* 26 23  GLUCOSE 83 85 89 76  BUN 12 11 12 15   CALCIUM 9.7 8.4* 8.4* 8.5*  CREATININE 1.06 1.08 1.03 0.99  GFRNONAA  --  >60 >60 >60  GFRAA  --  >60 >60 >60    LIVER FUNCTION TESTS: Recent Labs    11/17/16 0923 09/05/17 1429  BILITOT 1.0 0.7  AST  22 23  ALT 16 10  ALKPHOS 64 48  PROT 7.5 7.9  ALBUMIN 4.0 2.6*    TUMOR MARKERS: No results for input(s): AFPTM, CEA, CA199, CHROMGRNA in the last 8760 hours.  Assessment and Plan: Large left lung mass Imaging reviewed by Dr. Kathlene Cote. For CT guided biopsy of lung lesion today Labs ok Risks and benefits discussed with the patient including, but not limited to bleeding, hemoptysis, respiratory failure requiring intubation, infection, pneumothorax requiring chest tube placement, stroke from air embolism or even death.  All of the patient's questions were answered, patient is agreeable to proceed. Consent signed and in chart.    Thank you for this interesting consult.  I greatly enjoyed meeting BRAYLYN KALTER and look forward to participating in their care.  A copy of this report was sent to the requesting provider on this date.  Electronically Signed: Ascencion Dike, PA-C 09/07/2017, 11:52 AM   I spent a total of 20 minutes in face to face in clinical consultation, greater than 50% of which was counseling/coordinating care for lung mass biopsy

## 2017-09-07 NOTE — Sedation Documentation (Signed)
Dr. Kathlene Cote made aware of the increase in pt's 02 to 5L [place]. Pt ok to proceed with lung biopsy.

## 2017-09-07 NOTE — Progress Notes (Signed)
Progress Note  Patient Name: Corey Dorsey Date of Encounter: 09/07/2017  Primary Cardiologist:  New  Subjective   Breathing stable   Shoulder pain    Inpatient Medications    Scheduled Meds: . aspirin EC  81 mg Oral Daily  . heparin injection (subcutaneous)  5,000 Units Subcutaneous Q8H   Continuous Infusions:  PRN Meds: acetaminophen **OR** acetaminophen, ondansetron **OR** ondansetron (ZOFRAN) IV, oxyCODONE, zolpidem   Vital Signs    Vitals:   09/06/17 0452 09/06/17 1403 09/06/17 2139 09/07/17 0644  BP: 103/69 108/68 120/80 126/78  Pulse: 78 76 85 86  Resp: 16 16 16 20   Temp: 99.1 F (37.3 C) 98.1 F (36.7 C) 98.3 F (36.8 C) 98.7 F (37.1 C)  TempSrc: Oral Oral Oral Oral  SpO2: 96% 94% 95% 93%  Weight: 56.5 kg (124 lb 9 oz)   56.2 kg (123 lb 14.4 oz)  Height:        Intake/Output Summary (Last 24 hours) at 09/07/2017 1125 Last data filed at 09/07/2017 0730 Gross per 24 hour  Intake 440 ml  Output 400 ml  Net 40 ml   Filed Weights   09/06/17 0452 09/07/17 0644  Weight: 56.5 kg (124 lb 9 oz) 56.2 kg (123 lb 14.4 oz)    Telemetry    SR  - Personally Reviewed  ECG      Physical Exam   GEN: Pt in NAD   Neck: No JVD Cardiac: RRR, no murmurs, rubs, or gallops.  Respiratory: Clear to auscultation bilaterally. GI: Soft, nontender, non-distended  MS: No edema; No deformity.  L shoulder tender  Neuro:  Nonfocal  Psych: Normal affect   Labs    Chemistry Recent Labs  Lab 09/05/17 1429 09/06/17 0459 09/07/17 0526  NA 131* 136 133*  K 3.9 4.3 4.5  CL 102 104 102  CO2 21* 26 23  GLUCOSE 85 89 76  BUN 11 12 15   CREATININE 1.08 1.03 0.99  CALCIUM 8.4* 8.4* 8.5*  PROT 7.9  --   --   ALBUMIN 2.6*  --   --   AST 23  --   --   ALT 10  --   --   ALKPHOS 48  --   --   BILITOT 0.7  --   --   GFRNONAA >60 >60 >60  GFRAA >60 >60 >60  ANIONGAP 8 6 8      Hematology Recent Labs  Lab 09/05/17 1429 09/05/17 2312 09/06/17 0459  WBC 7.2   --  6.2  RBC 3.97* 3.46* 3.32*  HGB 9.8*  --  8.2*  HCT 30.9*  --  26.4*  MCV 77.8*  --  79.5  MCH 24.7*  --  24.7*  MCHC 31.7  --  31.1  RDW 15.4  --  15.7*  PLT 292  --  267    Cardiac Enzymes Recent Labs  Lab 09/05/17 1429 09/05/17 2312 09/06/17 0459 09/06/17 1117  TROPONINI 1.05* 1.13* 1.04* 0.97*   No results for input(s): TROPIPOC in the last 168 hours.   BNPNo results for input(s): BNP, PROBNP in the last 168 hours.   DDimer  Recent Labs  Lab 09/05/17 1430  DDIMER 3.91*     Radiology    Dg Chest 2 View  Result Date: 09/05/2017 CLINICAL DATA:  Hemoptysis.  History of smoking. EXAM: CHEST - 2 VIEW COMPARISON:  Prior report 11/09/1998. FINDINGS: Multiple bilateral pulmonary mass lesions and infiltrates are present. Findings are suspicious for metastatic disease with  possible superimposed pneumonia. Small bilateral pleural effusions, left side greater than right. Elevation left hemidiaphragm. No pneumothorax. Heart size normal. No acute bony abnormality. IMPRESSION: Multiple bilateral pulmonary mass lesions infiltrates are present. Findings suspicious for metastatic disease with possible superimposed pneumonia. Small bilateral pleural effusions. Contrast-enhanced chest CT should be considered for further evaluation. Electronically Signed   By: Marcello Moores  Register   On: 09/05/2017 15:03   Ct Angio Chest Pe W And/or Wo Contrast  Result Date: 09/05/2017 CLINICAL DATA:  Intermittent chest pain for 2 months. Hemoptysis. Decreased appetite. History of smoking. EXAM: CT ANGIOGRAPHY CHEST WITH CONTRAST TECHNIQUE: Multidetector CT imaging of the chest was performed using the standard protocol during bolus administration of intravenous contrast. Multiplanar CT image reconstructions and MIPs were obtained to evaluate the vascular anatomy. CONTRAST:  163mL ISOVUE-370 IOPAMIDOL (ISOVUE-370) INJECTION 76% COMPARISON:  Chest radiograph 09/05/2017 FINDINGS: Cardiovascular: Pulmonary arterial  opacification is adequate without evidence of emboli. Mild thoracic aortic atherosclerosis is noted without aneurysm. The heart is normal in size. Coronary artery atherosclerosis is noted. There is a trace pericardial effusion. Mediastinum/Nodes: Bilateral gynecomastia. Scattered subcentimeter mediastinal lymph nodes, the largest measuring 9 mm in short axis in the AP window. Enlarged hilar lymph nodes measure up to 14 mm on the left. Lungs/Pleura: A large mass in the lingula measures 10.6 x 8.6 x 9.2 cm (AP x transverse x craniocaudal) and extends to both the lateral and mediastinal pleural surfaces with some adjacent pleural thickening. A trace left pleural effusion versus pleural thickening is noted posteriorly in the left lower hemithorax. There are innumerable pulmonary nodules throughout both lungs. An anterior right upper lobe mass extending to the minor fissure measures 3.9 x 2.6 cm. Moderate centrilobular and paraseptal emphysema is noted. Areas of ground-glass opacity and interlobular septal thickening are present bilaterally, greatest in the left upper lobe and left lower lobe. Upper Abdomen: Suspected stone in the gallbladder, incompletely imaged. Musculoskeletal: No suspicious lytic or blastic osseous lesion. Mild anterior wedging of the T12 vertebral body, most likely chronic. Review of the MIP images confirms the above findings. IMPRESSION: 1. No evidence of pulmonary emboli. 2. 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 Dominant 10 cm lingular mass may reflect primary bronchogenic carcinoma. 3. Mildly enlarged hilar lymph nodes, concerning for nodal metastases. 4. Aortic Atherosclerosis (ICD10-I70.0) and Emphysema (ICD10-J43.9). Electronically Signed   By: Logan Bores M.D.   On: 09/05/2017 16:08    Assessment:  1  Chest pain   Atypical, prob musculoskel in origin  Troponin flat   Last 0.97     I would not plan any further testing for now.     2  Lung mass   For CT guided  BX today      For questions or updates, please contact [place] Please consult [email or web address] for contact info under Cardiology/STEMI.      Signed, Dorris Carnes, MD  09/07/2017, 11:25 AM

## 2017-09-07 NOTE — Progress Notes (Signed)
Patient returned from procedure(Biopsy) dressing to left chest CDI. Pt denies any N/V, pain and or discomfort.Will continue to monitor. See flowsheet VS

## 2017-09-08 MED ORDER — MORPHINE SULFATE (PF) 2 MG/ML IV SOLN
2.0000 mg | Freq: Once | INTRAVENOUS | Status: AC
  Administered 2017-09-08: 2 mg via INTRAVENOUS
  Filled 2017-09-08: qty 1

## 2017-09-08 MED ORDER — LEVOFLOXACIN 750 MG PO TABS
750.0000 mg | ORAL_TABLET | ORAL | Status: DC
  Administered 2017-09-08: 750 mg via ORAL
  Filled 2017-09-08 (×2): qty 1

## 2017-09-08 NOTE — Progress Notes (Signed)
Progress Note  Patient Name: Corey Dorsey Date of Encounter: 09/08/2017  Primary Cardiologist:  New  Subjective   Pt with cough   Bloody sputum   Still with CP     Inpatient Medications    Scheduled Meds: . aspirin EC  81 mg Oral Daily  . heparin injection (subcutaneous)  5,000 Units Subcutaneous Q8H  . levofloxacin  750 mg Oral Q48H   Continuous Infusions:  PRN Meds: acetaminophen **OR** acetaminophen, ondansetron **OR** ondansetron (ZOFRAN) IV, oxyCODONE, zolpidem   Vital Signs    Vitals:   09/07/17 1830 09/07/17 2111 09/08/17 0508 09/08/17 0839  BP: 128/82 121/82 112/76 106/72  Pulse: 76 97 80 73  Resp: 16 20 18 18   Temp:  99.1 F (37.3 C) 98.6 F (37 C)   TempSrc:  Oral Oral   SpO2: 94% 93% 91%   Weight:   123 lb 14.4 oz (56.2 kg)   Height:        Intake/Output Summary (Last 24 hours) at 09/08/2017 1343 Last data filed at 09/08/2017 0511 Gross per 24 hour  Intake -  Output 350 ml  Net -350 ml   Filed Weights   09/06/17 0452 09/07/17 0644 09/08/17 0508  Weight: 124 lb 9 oz (56.5 kg) 123 lb 14.4 oz (56.2 kg) 123 lb 14.4 oz (56.2 kg)    Telemetry    SR  - Personally Reviewed  ECG      Physical Exam   GEN: Pt in NAD  Cachectic   Neck: No JVD Cardiac: RRR, no murmurs, rubs, or gallops.  Chest:  Tender to palpation L parasternal region Respiratory: Rhonchi, L>R   GI: Soft, nontender, non-distended  MS: No edema; No deformity.  L shoulder tender  Neuro:  Nonfocal  Psych: Normal affect   Labs    Chemistry Recent Labs  Lab 09/05/17 1429 09/06/17 0459 09/07/17 0526  NA 131* 136 133*  K 3.9 4.3 4.5  CL 102 104 102  CO2 21* 26 23  GLUCOSE 85 89 76  BUN 11 12 15   CREATININE 1.08 1.03 0.99  CALCIUM 8.4* 8.4* 8.5*  PROT 7.9  --   --   ALBUMIN 2.6*  --   --   AST 23  --   --   ALT 10  --   --   ALKPHOS 48  --   --   BILITOT 0.7  --   --   GFRNONAA >60 >60 >60  GFRAA >60 >60 >60  ANIONGAP 8 6 8      Hematology Recent Labs    Lab 09/05/17 1429 09/05/17 2312 09/06/17 0459  WBC 7.2  --  6.2  RBC 3.97* 3.46* 3.32*  HGB 9.8*  --  8.2*  HCT 30.9*  --  26.4*  MCV 77.8*  --  79.5  MCH 24.7*  --  24.7*  MCHC 31.7  --  31.1  RDW 15.4  --  15.7*  PLT 292  --  267    Cardiac Enzymes Recent Labs  Lab 09/05/17 1429 09/05/17 2312 09/06/17 0459 09/06/17 1117  TROPONINI 1.05* 1.13* 1.04* 0.97*   No results for input(s): TROPIPOC in the last 168 hours.   BNPNo results for input(s): BNP, PROBNP in the last 168 hours.   DDimer  Recent Labs  Lab 09/05/17 1430  DDIMER 3.91*     Radiology    Ct Shoulder Left Wo Contrast  Result Date: 09/07/2017 CLINICAL DATA:  Left shoulder pain EXAM: CT OF THE UPPER LEFT  EXTREMITY WITHOUT CONTRAST TECHNIQUE: Multidetector CT imaging of the upper left extremity was performed according to the standard protocol. COMPARISON:  09/05/2017 CT chest FINDINGS: Bones/Joint/Cartilage Flattening of the posterolateral humeral head which could be due to Hill-Sachs impaction. Degenerative glenohumeral spurring with some chronic fragmentation along the posterosuperior glenoid. Chondrocalcinosis of the glenoid labrum. Subacromial morphology is type 2 (curved). Ligaments Suboptimally assessed by CT. Muscles and Tendons Chronic appearing atrophy of the supraspinatus and infraspinatus muscles favoring chronic rotator cuff ruptures. Soft tissues No obvious mass lesions along visualized segment of the brachial plexus. Extensive tumor burden the left chest. Severe emphysema. No obvious regional bony metastatic lesions; the ribs are blurred by motion artifact. Scattered airspace opacities especially in the left upper lobe. IMPRESSION: 1. 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 2. Flattening of the posterolateral humeral head which may be from remote Hill-Sachs impaction. 3. Extensive tumor burden in the left chest with  severe emphysema. 4. Airspace opacity in the left upper lobe, potentially from pneumonia. 5. 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 Mild degenerative glenohumeral spurring. Electronically Signed   By: Van Clines M.D.   On: 09/07/2017 18:25   Ct Biopsy  Result Date: 09/07/2017 CLINICAL DATA:  Large left upper lobe/lingular lung mass with multiple additional bilateral pulmonary masses. EXAM: CT GUIDED CORE BIOPSY OF LEFT LUNG MASS ANESTHESIA/SEDATION: 1.5 mg IV Versed; 50 mcg IV Fentanyl Total Moderate Sedation Time:  10 minutes. The patient's level of consciousness and physiologic status were continuously monitored during the procedure by Radiology nursing. PROCEDURE: The procedure risks, benefits, and alternatives were explained to the patient. Questions regarding the procedure were encouraged and answered. The patient understands and consents to the procedure. A time-out was performed prior to initiating the procedure. CT was performed through the chest in a supine position. The left anterior chest wall was prepped with chlorhexidine in a sterile fashion, and a sterile drape was applied covering the operative field. A sterile gown and sterile gloves were used for the procedure. Local anesthesia was provided with 1% Lidocaine. A 17 gauge trocar needle was advanced into a left lung mass under CT guidance. Three separate coaxial 18 gauge core biopsy samples were obtained and submitted in formalin. Additional CT images were performed. COMPLICATIONS: None FINDINGS: Large left-sided lung mass centered in the lower left upper lobe and extending into the lingula measures approximately 10.8 cm in maximum diameter. Solid tissue was obtained. IMPRESSION: CT-guided biopsy performed of the large left-sided lung mass measuring nearly 11 cm in diameter. Electronically Signed   By: Aletta Edouard M.D.   On: 09/07/2017 17:22    Assessment:  1  Chest pain   Atypical, prob  musculoskel in origin Pt with probable CAD    Troponin flat   Last 0.97     I would not plan any further testing for now.     2  Lung mass  CT bx yesterday  Coughing up some blood now      For questions or updates, please contact [place] Please consult [email or web address] for contact info under Cardiology/STEMI.      Signed, Dorris Carnes, MD  09/08/2017, 1:43 PM

## 2017-09-08 NOTE — Evaluation (Signed)
Physical Therapy Evaluation Patient Details Name: Corey Dorsey MRN: [id number] DOB: Apr 22, 1940 Today's Date: 09/08/2017   History of Present Illness  77 y.o. male with no significant past medical history and has not been to a doctor for many years started experiencing chest pain in the left anterior chest wall for the last few weeks.  Chest pain is present even at rest and increased on deep inspiration.  Has been examined productive cough sometimes blood-tinged mostly in the mornings.  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 Pt had CT-guided biopsy of Left lung mass on 09/07/17  Clinical Impression  Pt admitted with above diagnosis. Pt currently with functional limitations due to the deficits listed below (see PT Problem List).  Pt will benefit from skilled PT to increase their independence and safety with mobility to allow discharge to the venue listed below.  Pt agreeable to mobilize however had coughing spells and fatigued quickly.   Pt up to sink area and then over to recliner.  Pt requiring supplemental oxygen at this time.  Recommend assist for mobility upon d/c and no family present.  If family is unable to assist pt then pt may need SNF upon d/c.  SATURATION QUALIFICATIONS: (This note is used to comply with regulatory documentation for home oxygen)  Patient Saturations on Room Air at Rest = 91  Patient Saturations on Room Air while Ambulating = 84% (with sitting EOB)  Patient Saturations on 3.5 Liters of oxygen while Ambulating = 95%  Please briefly explain why patient needs home oxygen: to improve oxygen saturations during physical activities such as ambulation.     Follow Up Recommendations Supervision/Assistance - 24 hour;SNF    Equipment Recommendations  Rolling walker with 5" wheels    Recommendations for Other Services       Precautions / Restrictions Precautions Precautions: Fall Precaution Comments:  monitor oxygen      Mobility  Bed Mobility Overal bed mobility: Needs Assistance Bed Mobility: Supine to Sit     Supine to sit: Min guard        Transfers Overall transfer level: Needs assistance Equipment used: 2 person hand held assist Transfers: Sit to/from Stand Sit to Stand: Min assist         General transfer comment: assist for rise and steady, pt declined RW but requested HHA, SPO2 dropped to 84% on room air with sitting so reapplied 3.5L O2 [place] for standing and mobility  Ambulation/Gait Ambulation/Gait assistance: Min assist Gait Distance (Feet): 5 Feet Assistive device: None Gait Pattern/deviations: Step-through pattern;Decreased stride length     General Gait Details: small short steps to sink and then to recliner (pt was attempting to brush his teeth however coughing spells occurred and he felt too fatigued to remain standing)  Stairs            Wheelchair Mobility    Modified Rankin (Stroke Patients Only)       Balance                                             Pertinent Vitals/Pain Pain Assessment: Faces Faces Pain Scale: Hurts little more Pain Location: chest from coughing Pain Descriptors / Indicators: Sore Pain Intervention(s): Limited activity within patient's tolerance;Repositioned;Monitored during session    Home Living Family/patient expects to be discharged to:: Private residence Living Arrangements: Other  relatives   Type of Home: Apartment Home Access: Elevator       Home Equipment: None Additional Comments: pt plans to d/c home with his daughter    Prior Function Level of Independence: Independent               Hand Dominance        Extremity/Trunk Assessment   Upper Extremity Assessment Upper Extremity Assessment: Generalized weakness    Lower Extremity Assessment Lower Extremity Assessment: Generalized weakness(Cachectic appearance)       Communication   Communication: HOH   Cognition Arousal/Alertness: Awake/alert Behavior During Therapy: WFL for tasks assessed/performed Overall Cognitive Status: Within Functional Limits for tasks assessed                                        General Comments      Exercises     Assessment/Plan    PT Assessment Patient needs continued PT services  PT Problem List Decreased strength;Decreased mobility;Decreased activity tolerance;Decreased balance;Decreased knowledge of use of DME;Cardiopulmonary status limiting activity       PT Treatment Interventions DME instruction;Therapeutic activities;Gait training;Therapeutic exercise;Patient/family education;Functional mobility training;Balance training    PT Goals (Current goals can be found in the Care Plan section)  Acute Rehab PT Goals PT Goal Formulation: With patient Time For Goal Achievement: 09/22/17 Potential to Achieve Goals: Good    Frequency Min 2X/week   Barriers to discharge        Co-evaluation               AM-PAC PT "6 Clicks" Daily Activity  Outcome Measure Difficulty turning over in bed (including adjusting bedclothes, sheets and blankets)?: A Little Difficulty moving from lying on back to sitting on the side of the bed? : A Little Difficulty sitting down on and standing up from a chair with arms (e.g., wheelchair, bedside commode, etc,.)?: Unable Help needed moving to and from a bed to chair (including a wheelchair)?: A Little Help needed walking in hospital room?: A Lot Help needed climbing 3-5 steps with a railing? : Total 6 Click Score: 13    End of Session Equipment Utilized During Treatment: Oxygen;Gait belt Activity Tolerance: Patient limited by fatigue Patient left: in chair;with chair alarm set;with call bell/phone within reach Nurse Communication: Mobility status PT Visit Diagnosis: Difficulty in walking, not elsewhere classified (R26.2);Muscle weakness (generalized) (M62.81)    Time: 3614-4315 PT Time  Calculation (min) (ACUTE ONLY): 18 min   Charges:   PT Evaluation $PT Eval Low Complexity: 1 Low     PT G Codes:        Carmelia Bake, PT, DPT 09/08/2017 Pager: [phone]  York Ram E 09/08/2017, 3:32 PM

## 2017-09-08 NOTE — Care Management Important Message (Signed)
Important Message  Patient Details  Name: Corey Dorsey MRN: [id number] Date of Birth: 1940-03-30   Medicare Important Message Given:  Yes    Kerin Salen 09/08/2017, 11:55 AMImportant Message  Patient Details  Name: Corey Dorsey MRN: [id number] Date of Birth: 06-Dec-1940   Medicare Important Message Given:  Yes    Kerin Salen 09/08/2017, 11:55 AM

## 2017-09-08 NOTE — Progress Notes (Signed)
Patient to d/c home to daughter's house @ d/c. Awaiting PT recc. On 02-will monitor if needed @ home-can arrange with documented 02 sats, & home 02 order.

## 2017-09-08 NOTE — Progress Notes (Signed)
Patient coughing up bloody sputum overnight. Will continue to monitor patient.

## 2017-09-08 NOTE — Progress Notes (Signed)
[facility] PROGRESS NOTE  Corey Dorsey:[id number] DOB: 12-27-1940 DOA: 09/05/2017   PCP: Golden Circle, FNP     Assessment/Plan: Principal Problem:   Lung mass Active Problems:   Tobacco use disorder   Shortness of breath   Elevated troponin   Microcytic hypochromic anemia   77 y.o. male with no significant past medical history and has not been to a doctor for many years started experiencing chest pain in the left anterior chest wall for the last few weeks.  Chest pain is present even at rest and increased on deep inspiration.  Has been examined productive cough sometimes blood-tinged mostly in the mornings.  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 Patient scheduled for CT-guided biopsy for possible lung cancer. Troponin was mildly elevated and EKG shows normal sinus rhythm.   cardiology and pulmonary have been consulted.  Had lung biopsy yesterday but still weak and tired  Assessment and plan 1. 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   S/P CT-guided low biopsy.  Patient still requiring high flow oxygen at 4 L/min.  Oncology evaluation once biopsy results are available 2. Chest pain with elevated troponin. troponins have been flat 4.MI has been ruled out effectively.  2D echo  EF of 55-60%. Grade 1 diastolic dysfunction. . Started on aspirin by cardiology. 3. Microcytic hypochromic anemia. Hemoglobin stable around 8-8.5. Anemia panel shows low iron with low TIBC , but also low oxygen saturations.  Recheck hemoglobin in the morning 4. Left shoulder pain: Pain is improved now.  Continue supportive care 5. Protein calorie malnutrition with cachexia: Continue to improve protein intake.  Get physical therapy and Occupational Therapy consult    DVT prophylaxsis  Heparin subcutaneous  Code Status:   Full code   Family Communication: Discussed in detail with the patient and daughter, all imaging  results, lab results explained to the patient   Disposition Plan:  Pending further workup, PT OT evaluation     Consultants:  Cardiology  Pulmonology  Procedures:  none  Antibiotics: Anti-infectives (From admission, onward)   Start     Dose/Rate Route Frequency Ordered Stop   09/08/17 1000  levofloxacin (LEVAQUIN) tablet 750 mg  Status:  Discontinued     750 mg Oral Daily 09/07/17 2312 09/08/17 0359   09/08/17 1000  levofloxacin (LEVAQUIN) tablet 750 mg     750 mg Oral Every 48 hours 09/08/17 0359           HPI/Subjective: Continues to require high level oxygen.  Awake alert but no hemoptysis today  Objective: Vitals:   09/07/17 2111 09/08/17 0508 09/08/17 0839 09/08/17 1415  BP: 121/82 112/76 106/72 117/78  Pulse: 97 80 73 86  Resp: 20 18 18 16   Temp: 99.1 F (37.3 C) 98.6 F (37 C)  98.7 F (37.1 C)  TempSrc: Oral Oral  Oral  SpO2: 93% 91%  92%  Weight:  56.2 kg (123 lb 14.4 oz)    Height:        Intake/Output Summary (Last 24 hours) at 09/08/2017 2016 Last data filed at 09/08/2017 1450 Gross per 24 hour  Intake 240 ml  Output 500 ml  Net -260 ml    Exam:  Examination:  General exam:  cachectic Respiratory system: Clear to auscultation. Respiratory effort normal. Cardiovascular system: S1 & S2 heard, RRR. No JVD, murmurs, rubs, gallops or clicks. No pedal edema.tenderness to palpation in the sternal area Gastrointestinal  system: Abdomen is nondistended, soft and nontender. No organomegaly or masses felt. Normal bowel sounds heard. Central nervous system: Alert and oriented. No focal neurological deficits. Extremities: Symmetric 5 x 5 power. Skin: No rashes, lesions or ulcers Psychiatry: Judgement and insight appear normal. Mood & affect appropriate.     Data Reviewed: I have personally reviewed following labs and imaging studies  Micro Results No results found for this or any previous visit (from the past 240 hour(s)).  Radiology Reports Dg  Chest 2 View  Result Date: 09/05/2017 CLINICAL DATA:  Hemoptysis.  History of smoking. EXAM: CHEST - 2 VIEW COMPARISON:  Prior report 11/09/1998. FINDINGS: Multiple bilateral pulmonary mass lesions and infiltrates are present. Findings are suspicious for metastatic disease with possible superimposed pneumonia. Small bilateral pleural effusions, left side greater than right. Elevation left hemidiaphragm. No pneumothorax. Heart size normal. No acute bony abnormality. IMPRESSION: Multiple bilateral pulmonary mass lesions infiltrates are present. Findings suspicious for metastatic disease with possible superimposed pneumonia. Small bilateral pleural effusions. Contrast-enhanced chest CT should be considered for further evaluation. Electronically Signed   By: Marcello Moores  Register   On: 09/05/2017 15:03   Ct Angio Chest Pe W And/or Wo Contrast  Result Date: 09/05/2017 CLINICAL DATA:  Intermittent chest pain for 2 months. Hemoptysis. Decreased appetite. History of smoking. EXAM: CT ANGIOGRAPHY CHEST WITH CONTRAST TECHNIQUE: Multidetector CT imaging of the chest was performed using the standard protocol during bolus administration of intravenous contrast. Multiplanar CT image reconstructions and MIPs were obtained to evaluate the vascular anatomy. CONTRAST:  113mL ISOVUE-370 IOPAMIDOL (ISOVUE-370) INJECTION 76% COMPARISON:  Chest radiograph 09/05/2017 FINDINGS: Cardiovascular: Pulmonary arterial opacification is adequate without evidence of emboli. Mild thoracic aortic atherosclerosis is noted without aneurysm. The heart is normal in size. Coronary artery atherosclerosis is noted. There is a trace pericardial effusion. Mediastinum/Nodes: Bilateral gynecomastia. Scattered subcentimeter mediastinal lymph nodes, the largest measuring 9 mm in short axis in the AP window. Enlarged hilar lymph nodes measure up to 14 mm on the left. Lungs/Pleura: A large mass in the lingula measures 10.6 x 8.6 x 9.2 cm (AP x transverse x  craniocaudal) and extends to both the lateral and mediastinal pleural surfaces with some adjacent pleural thickening. A trace left pleural effusion versus pleural thickening is noted posteriorly in the left lower hemithorax. There are innumerable pulmonary nodules throughout both lungs. An anterior right upper lobe mass extending to the minor fissure measures 3.9 x 2.6 cm. Moderate centrilobular and paraseptal emphysema is noted. Areas of ground-glass opacity and interlobular septal thickening are present bilaterally, greatest in the left upper lobe and left lower lobe. Upper Abdomen: Suspected stone in the gallbladder, incompletely imaged. Musculoskeletal: No suspicious lytic or blastic osseous lesion. Mild anterior wedging of the T12 vertebral body, most likely chronic. Review of the MIP images confirms the above findings. IMPRESSION: 1. No evidence of pulmonary emboli. 2. 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 Dominant 10 cm lingular mass may reflect primary bronchogenic carcinoma. 3. Mildly enlarged hilar lymph nodes, concerning for nodal metastases. 4. Aortic Atherosclerosis (ICD10-I70.0) and Emphysema (ICD10-J43.9). Electronically Signed   By: Logan Bores M.D.   On: 09/05/2017 16:08   Ct Shoulder Left Wo Contrast  Result Date: 09/07/2017 CLINICAL DATA:  Left shoulder pain EXAM: CT OF THE UPPER LEFT EXTREMITY WITHOUT CONTRAST TECHNIQUE: Multidetector CT imaging of the upper left extremity was performed according to the standard protocol. COMPARISON:  09/05/2017 CT chest FINDINGS: Bones/Joint/Cartilage Flattening of the posterolateral humeral head which could be due to Hill-Sachs  impaction. Degenerative glenohumeral spurring with some chronic fragmentation along the posterosuperior glenoid. Chondrocalcinosis of the glenoid labrum. Subacromial morphology is type 2 (curved). Ligaments Suboptimally assessed by CT. Muscles and Tendons Chronic appearing atrophy of the supraspinatus and  infraspinatus muscles favoring chronic rotator cuff ruptures. Soft tissues No obvious mass lesions along visualized segment of the brachial plexus. Extensive tumor burden the left chest. Severe emphysema. No obvious regional bony metastatic lesions; the ribs are blurred by motion artifact. Scattered airspace opacities especially in the left upper lobe. IMPRESSION: 1. 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 2. Flattening of the posterolateral humeral head which may be from remote Hill-Sachs impaction. 3. Extensive tumor burden in the left chest with severe emphysema. 4. Airspace opacity in the left upper lobe, potentially from pneumonia. 5. 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 Mild degenerative glenohumeral spurring. Electronically Signed   By: Van Clines M.D.   On: 09/07/2017 18:25   Ct Biopsy  Result Date: 09/07/2017 CLINICAL DATA:  Large left upper lobe/lingular lung mass with multiple additional bilateral pulmonary masses. EXAM: CT GUIDED CORE BIOPSY OF LEFT LUNG MASS ANESTHESIA/SEDATION: 1.5 mg IV Versed; 50 mcg IV Fentanyl Total Moderate Sedation Time:  10 minutes. The patient's level of consciousness and physiologic status were continuously monitored during the procedure by Radiology nursing. PROCEDURE: The procedure risks, benefits, and alternatives were explained to the patient. Questions regarding the procedure were encouraged and answered. The patient understands and consents to the procedure. A time-out was performed prior to initiating the procedure. CT was performed through the chest in a supine position. The left anterior chest wall was prepped with chlorhexidine in a sterile fashion, and a sterile drape was applied covering the operative field. A sterile gown and sterile gloves were used for the procedure. Local anesthesia was provided with 1% Lidocaine. A 17  gauge trocar needle was advanced into a left lung mass under CT guidance. Three separate coaxial 18 gauge core biopsy samples were obtained and submitted in formalin. Additional CT images were performed. COMPLICATIONS: None FINDINGS: Large left-sided lung mass centered in the lower left upper lobe and extending into the lingula measures approximately 10.8 cm in maximum diameter. Solid tissue was obtained. IMPRESSION: CT-guided biopsy performed of the large left-sided lung mass measuring nearly 11 cm in diameter. Electronically Signed   By: Aletta Edouard M.D.   On: 09/07/2017 17:22     CBC Recent Labs  Lab 09/05/17 1429 09/06/17 0459  WBC 7.2 6.2  HGB 9.8* 8.2*  HCT 30.9* 26.4*  PLT 292 267  MCV 77.8* 79.5  MCH 24.7* 24.7*  MCHC 31.7 31.1  RDW 15.4 15.7*  LYMPHSABS 1.2  --   MONOABS 0.5  --   EOSABS 0.1  --   BASOSABS 0.0  --     Chemistries  Recent Labs  Lab 09/05/17 1429 09/06/17 0459 09/07/17 0526  NA 131* 136 133*  K 3.9 4.3 4.5  CL 102 104 102  CO2 21* 26 23  GLUCOSE 85 89 76  BUN 11 12 15   CREATININE 1.08 1.03 0.99  CALCIUM 8.4* 8.4* 8.5*  AST 23  --   --   ALT 10  --   --   ALKPHOS 48  --   --   BILITOT 0.7  --   --    ------------------------------------------------------------------------------------------------------------------ estimated creatinine clearance is 48.9 mL/min (by C-G formula based on SCr of 0.99 mg/dL). ------------------------------------------------------------------------------------------------------------------ No results  for input(s): HGBA1C in the last 72 hours. ------------------------------------------------------------------------------------------------------------------ No results for input(s): CHOL, HDL, LDLCALC, TRIG, CHOLHDL, LDLDIRECT in the last 72 hours. ------------------------------------------------------------------------------------------------------------------ Recent Labs    09/05/17 2312  TSH 3.464    ------------------------------------------------------------------------------------------------------------------ Recent Labs    09/05/17 2312  VITAMINB12 399  FOLATE 6.5  FERRITIN 177  TIBC 221*  IRON 29*  RETICCTPCT 1.5    Coagulation profile Recent Labs  Lab 09/05/17 1708  INR 1.05    No results for input(s): DDIMER in the last 72 hours.  Cardiac Enzymes Recent Labs  Lab 09/05/17 2312 09/06/17 0459 09/06/17 1117  TROPONINI 1.13* 1.04* 0.97*   ------------------------------------------------------------------------------------------------------------------ Invalid input(s): POCBNP   CBG: No results for input(s): GLUCAP in the last 168 hours.     Studies: Ct Shoulder Left Wo Contrast  Result Date: 09/07/2017 CLINICAL DATA:  Left shoulder pain EXAM: CT OF THE UPPER LEFT EXTREMITY WITHOUT CONTRAST TECHNIQUE: Multidetector CT imaging of the upper left extremity was performed according to the standard protocol. COMPARISON:  09/05/2017 CT chest FINDINGS: Bones/Joint/Cartilage Flattening of the posterolateral humeral head which could be due to Hill-Sachs impaction. Degenerative glenohumeral spurring with some chronic fragmentation along the posterosuperior glenoid. Chondrocalcinosis of the glenoid labrum. Subacromial morphology is type 2 (curved). Ligaments Suboptimally assessed by CT. Muscles and Tendons Chronic appearing atrophy of the supraspinatus and infraspinatus muscles favoring chronic rotator cuff ruptures. Soft tissues No obvious mass lesions along visualized segment of the brachial plexus. Extensive tumor burden the left chest. Severe emphysema. No obvious regional bony metastatic lesions; the ribs are blurred by motion artifact. Scattered airspace opacities especially in the left upper lobe. IMPRESSION: 1. 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 2. Flattening of the  posterolateral humeral head which may be from remote Hill-Sachs impaction. 3. Extensive tumor burden in the left chest with severe emphysema. 4. Airspace opacity in the left upper lobe, potentially from pneumonia. 5. 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 Mild degenerative glenohumeral spurring. Electronically Signed   By: Van Clines M.D.   On: 09/07/2017 18:25   Ct Biopsy  Result Date: 09/07/2017 CLINICAL DATA:  Large left upper lobe/lingular lung mass with multiple additional bilateral pulmonary masses. EXAM: CT GUIDED CORE BIOPSY OF LEFT LUNG MASS ANESTHESIA/SEDATION: 1.5 mg IV Versed; 50 mcg IV Fentanyl Total Moderate Sedation Time:  10 minutes. The patient's level of consciousness and physiologic status were continuously monitored during the procedure by Radiology nursing. PROCEDURE: The procedure risks, benefits, and alternatives were explained to the patient. Questions regarding the procedure were encouraged and answered. The patient understands and consents to the procedure. A time-out was performed prior to initiating the procedure. CT was performed through the chest in a supine position. The left anterior chest wall was prepped with chlorhexidine in a sterile fashion, and a sterile drape was applied covering the operative field. A sterile gown and sterile gloves were used for the procedure. Local anesthesia was provided with 1% Lidocaine. A 17 gauge trocar needle was advanced into a left lung mass under CT guidance. Three separate coaxial 18 gauge core biopsy samples were obtained and submitted in formalin. Additional CT images were performed. COMPLICATIONS: None FINDINGS: Large left-sided lung mass centered in the lower left upper lobe and extending into the lingula measures approximately 10.8 cm in maximum diameter. Solid tissue was obtained. IMPRESSION: CT-guided biopsy performed of the large left-sided lung mass measuring nearly 11 cm in diameter.  Electronically Signed  By: Aletta Edouard M.D.   On: 09/07/2017 17:22      Lab Results  Component Value Date   HGBA1C 5.2 06/19/2012   Lab Results  Component Value Date   LDLCALC 113 (H) 11/17/2016   CREATININE 0.99 09/07/2017       Scheduled Meds: . aspirin EC  81 mg Oral Daily  . heparin injection (subcutaneous)  5,000 Units Subcutaneous Q8H  . levofloxacin  750 mg Oral Q48H   Continuous Infusions:   LOS: 3 days    Time spent: >30 MINS    [facility]  [facility] Pager [phone]. If 7PM-7AM, please contact night-coverage at [email or web address], password [facility] 09/08/2017, 8:16 PM  LOS: 3 days

## 2017-09-09 MED ORDER — MORPHINE SULFATE (PF) 2 MG/ML IV SOLN
2.0000 mg | Freq: Once | INTRAVENOUS | Status: AC
  Administered 2017-09-09: 2 mg via INTRAVENOUS

## 2017-09-09 MED ORDER — MORPHINE SULFATE (PF) 2 MG/ML IV SOLN
INTRAVENOUS | Status: AC
  Filled 2017-09-09: qty 1

## 2017-09-09 NOTE — Progress Notes (Signed)
PROGRESS NOTE    Corey Dorsey  ONG:[id number] DOB: 1941/01/03 DOA: 09/05/2017 PCP: [facility], FNP    Brief Narrative:  77 year old with no medical history admitted after experiencing anterior chest pain and pleuritic chest pain and found to have elevated troponin lung mass with multiple metastases who is status post biopsy on 09/07/2017.   Assessment & Plan:   Principal Problem:   Lung mass Active Problems:   Tobacco use disorder   Shortness of breath   Elevated troponin   Microcytic hypochromic anemia   #) Hypoxia: Patient's profound hypoxia requiring 4 L nasal cannula is unclear etiology.  He likely has underlying lung disease noted on CT scan probably secondary to chronic smoking. 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  Patient continues to have small volume hemoptysis which is likely sequelae from his biopsy.  Patient discussed with biopsy on 09/07/2017. -Outpatient follow-up with oncology  #) Elevated troponin: Echo showed only grade 1 diastolic dysfunction.  CT scan of the chest however did show significant calcific disease.  Unfortunately per cardiology do not feel like he is a very good candidate for intervention at this time particularly with the hemoptysis.  Fluids: Tolerating p.o. Elect lites: Monitor and supplement Nutrition: Regular diet  Prophylaxis: SCDs  Disposition: Pending weaning of oxygen versus discharge home on oxygen  Full code  Consultants:   Pulmonology  Interventional radiology  Oncology  Procedures:   CT-guided biopsy of lung mass on 09/07/2017  Antimicrobials:   None   Subjective: Patient reports he is doing fairly well.  He denies any nausea, vomiting, abdominal pain.  He continues to have small volume hemoptysis.  Objective: Vitals:   09/08/17 1415 09/08/17 2221 09/09/17 0552 09/09/17 1456  BP: 117/78 101/73 117/80 93/66  Pulse: 86 92 83 (!)  122  Resp: 16 20 20 16   Temp: 98.7 F (37.1 C) 98.3 F (36.8 C) 98.5 F (36.9 C) 99.2 F (37.3 C)  TempSrc: Oral   Oral  SpO2: 92% 96% 93% (!) 86%  Weight:   56.5 kg (124 lb 9 oz)   Height:        Intake/Output Summary (Last 24 hours) at 09/09/2017 1507 Last data filed at 09/09/2017 0900 Gross per 24 hour  Intake 120 ml  Output 100 ml  Net 20 ml   Filed Weights   09/07/17 0644 09/08/17 0508 09/09/17 0552  Weight: 56.2 kg (123 lb 14.4 oz) 56.2 kg (123 lb 14.4 oz) 56.5 kg (124 lb 9 oz)    Examination:  General exam: No acute distress Respiratory system: Diminished lung sounds throughout, no wheezes, rhonchi, rales. Cardiovascular system: Regular rate and rhythm, no murmurs Gastrointestinal system: Abdomen is nondistended, soft and nontender. No organomegaly or masses felt. Normal bowel sounds heard. Central nervous system: Alert and oriented. No focal neurological deficits. Extremities: No lower extremity edema Skin: No rashes visible skin Psychiatry: Judgement and insight appear normal. Mood & affect appropriate.     Data Reviewed: I have personally reviewed following labs and imaging studies  CBC: Recent Labs  Lab 09/05/17 1429 09/06/17 0459  WBC 7.2 6.2  NEUTROABS 5.5  --   HGB 9.8* 8.2*  HCT 30.9* 26.4*  MCV 77.8* 79.5  PLT 292 440   Basic Metabolic Panel: Recent Labs  Lab 09/05/17 1429 09/06/17 0459 09/07/17 0526  NA 131* 136 133*  K 3.9 4.3 4.5  CL 102 104 102  CO2 21* 26  23  GLUCOSE 85 89 76  BUN 11 12 15   CREATININE 1.08 1.03 0.99  CALCIUM 8.4* 8.4* 8.5*   GFR: Estimated Creatinine Clearance: 49.1 mL/min (by C-G formula based on SCr of 0.99 mg/dL). Liver Function Tests: Recent Labs  Lab 09/05/17 1429  AST 23  ALT 10  ALKPHOS 48  BILITOT 0.7  PROT 7.9  ALBUMIN 2.6*   No results for input(s): LIPASE, AMYLASE in the last 168 hours. No results for input(s): AMMONIA in the last 168 hours. Coagulation Profile: Recent Labs  Lab  09/05/17 1708  INR 1.05   Cardiac Enzymes: Recent Labs  Lab 09/05/17 1429 09/05/17 2312 09/06/17 0459 09/06/17 1117  TROPONINI 1.05* 1.13* 1.04* 0.97*   BNP (last 3 results) No results for input(s): PROBNP in the last 8760 hours. HbA1C: No results for input(s): HGBA1C in the last 72 hours. CBG: No results for input(s): GLUCAP in the last 168 hours. Lipid Profile: No results for input(s): CHOL, HDL, LDLCALC, TRIG, CHOLHDL, LDLDIRECT in the last 72 hours. Thyroid Function Tests: No results for input(s): TSH, T4TOTAL, FREET4, T3FREE, THYROIDAB in the last 72 hours. Anemia Panel: No results for input(s): VITAMINB12, FOLATE, FERRITIN, TIBC, IRON, RETICCTPCT in the last 72 hours. Sepsis Labs: No results for input(s): PROCALCITON, LATICACIDVEN in the last 168 hours.  No results found for this or any previous visit (from the past 240 hour(s)).       Radiology Studies: Ct Shoulder Left Wo Contrast  Result Date: 09/07/2017 CLINICAL DATA:  Left shoulder pain EXAM: CT OF THE UPPER LEFT EXTREMITY WITHOUT CONTRAST TECHNIQUE: Multidetector CT imaging of the upper left extremity was performed according to the standard protocol. COMPARISON:  09/05/2017 CT chest FINDINGS: Bones/Joint/Cartilage Flattening of the posterolateral humeral head which could be due to Hill-Sachs impaction. Degenerative glenohumeral spurring with some chronic fragmentation along the posterosuperior glenoid. Chondrocalcinosis of the glenoid labrum. Subacromial morphology is type 2 (curved). Ligaments Suboptimally assessed by CT. Muscles and Tendons Chronic appearing atrophy of the supraspinatus and infraspinatus muscles favoring chronic rotator cuff ruptures. Soft tissues No obvious mass lesions along visualized segment of the brachial plexus. Extensive tumor burden the left chest. Severe emphysema. No obvious regional bony metastatic lesions; the ribs are blurred by motion artifact. Scattered airspace opacities especially  in the left upper lobe. IMPRESSION: 1. 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 2. Flattening of the posterolateral humeral head which may be from remote Hill-Sachs impaction. 3. Extensive tumor burden in the left chest with severe emphysema. 4. Airspace opacity in the left upper lobe, potentially from pneumonia. 5. 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 Mild degenerative glenohumeral spurring. Electronically Signed   By: Van Clines M.D.   On: 09/07/2017 18:25   Ct Biopsy  Result Date: 09/07/2017 CLINICAL DATA:  Large left upper lobe/lingular lung mass with multiple additional bilateral pulmonary masses. EXAM: CT GUIDED CORE BIOPSY OF LEFT LUNG MASS ANESTHESIA/SEDATION: 1.5 mg IV Versed; 50 mcg IV Fentanyl Total Moderate Sedation Time:  10 minutes. The patient's level of consciousness and physiologic status were continuously monitored during the procedure by Radiology nursing. PROCEDURE: The procedure risks, benefits, and alternatives were explained to the patient. Questions regarding the procedure were encouraged and answered. The patient understands and consents to the procedure. A time-out was performed prior to initiating the procedure. CT was performed through the chest in a supine position. The left anterior chest wall was prepped with chlorhexidine in a  sterile fashion, and a sterile drape was applied covering the operative field. A sterile gown and sterile gloves were used for the procedure. Local anesthesia was provided with 1% Lidocaine. A 17 gauge trocar needle was advanced into a left lung mass under CT guidance. Three separate coaxial 18 gauge core biopsy samples were obtained and submitted in formalin. Additional CT images were performed. COMPLICATIONS: None FINDINGS: Large left-sided lung mass centered in the lower left upper lobe and extending into the lingula  measures approximately 10.8 cm in maximum diameter. Solid tissue was obtained. IMPRESSION: CT-guided biopsy performed of the large left-sided lung mass measuring nearly 11 cm in diameter. Electronically Signed   By: Aletta Edouard M.D.   On: 09/07/2017 17:22        Scheduled Meds: . aspirin EC  81 mg Oral Daily  . heparin injection (subcutaneous)  5,000 Units Subcutaneous Q8H  . levofloxacin  750 mg Oral Q48H   Continuous Infusions:   LOS: 4 days    Time spent: [place], MD [facility]  If 7PM-7AM, please contact night-coverage [email or web address] Password [email or web address] 09/09/2017, 3:07 PM

## 2017-09-09 NOTE — Progress Notes (Signed)
Progress Note  Patient Name: Corey Dorsey Date of Encounter: 09/09/2017  Primary Cardiologist:  New  Subjective   Shoulder sore  Sore at site of Bx   Breathing fair  Still coughing up blood    Inpatient Medications    Scheduled Meds: . aspirin EC  81 mg Oral Daily  . heparin injection (subcutaneous)  5,000 Units Subcutaneous Q8H  . levofloxacin  750 mg Oral Q48H  . morphine       Continuous Infusions:  PRN Meds: acetaminophen **OR** acetaminophen, ondansetron **OR** ondansetron (ZOFRAN) IV, oxyCODONE, zolpidem   Vital Signs    Vitals:   09/08/17 0839 09/08/17 1415 09/08/17 2221 09/09/17 0552  BP: 106/72 117/78 101/73 117/80  Pulse: 73 86 92 83  Resp: 18 16 20 20   Temp:  98.7 F (37.1 C) 98.3 F (36.8 C) 98.5 F (36.9 C)  TempSrc:  Oral    SpO2:  92% 96% 93%  Weight:    124 lb 9 oz (56.5 kg)  Height:        Intake/Output Summary (Last 24 hours) at 09/09/2017 1135 Last data filed at 09/09/2017 0900 Gross per 24 hour  Intake 360 ml  Output 250 ml  Net 110 ml   Filed Weights   09/07/17 0644 09/08/17 0508 09/09/17 0552  Weight: 123 lb 14.4 oz (56.2 kg) 123 lb 14.4 oz (56.2 kg) 124 lb 9 oz (56.5 kg)    Telemetry    SR  - Personally Reviewed  ECG      Physical Exam   GEN: Pt in NAD  Cachectic   Neck: JVP norma   Cardiac: RRR, no murmurs, rubs, or gallops.  Chest:  Tender to palpation L parasternal region and L shoulder   Respiratory: Rel clear GI: Soft, nontender, non-distended  MS: No edema; No deformity.  L shoulder tender  Neuro:  Nonfocal  Psych: Normal affect   Labs    Chemistry Recent Labs  Lab 09/05/17 1429 09/06/17 0459 09/07/17 0526  NA 131* 136 133*  K 3.9 4.3 4.5  CL 102 104 102  CO2 21* 26 23  GLUCOSE 85 89 76  BUN 11 12 15   CREATININE 1.08 1.03 0.99  CALCIUM 8.4* 8.4* 8.5*  PROT 7.9  --   --   ALBUMIN 2.6*  --   --   AST 23  --   --   ALT 10  --   --   ALKPHOS 48  --   --   BILITOT 0.7  --   --   GFRNONAA >60  >60 >60  GFRAA >60 >60 >60  ANIONGAP 8 6 8      Hematology Recent Labs  Lab 09/05/17 1429 09/05/17 2312 09/06/17 0459  WBC 7.2  --  6.2  RBC 3.97* 3.46* 3.32*  HGB 9.8*  --  8.2*  HCT 30.9*  --  26.4*  MCV 77.8*  --  79.5  MCH 24.7*  --  24.7*  MCHC 31.7  --  31.1  RDW 15.4  --  15.7*  PLT 292  --  267    Cardiac Enzymes Recent Labs  Lab 09/05/17 1429 09/05/17 2312 09/06/17 0459 09/06/17 1117  TROPONINI 1.05* 1.13* 1.04* 0.97*   No results for input(s): TROPIPOC in the last 168 hours.   BNPNo results for input(s): BNP, PROBNP in the last 168 hours.   DDimer  Recent Labs  Lab 09/05/17 1430  DDIMER 3.91*     Radiology    Ct Shoulder  Left Wo Contrast  Result Date: 09/07/2017 CLINICAL DATA:  Left shoulder pain EXAM: CT OF THE UPPER LEFT EXTREMITY WITHOUT CONTRAST TECHNIQUE: Multidetector CT imaging of the upper left extremity was performed according to the standard protocol. COMPARISON:  09/05/2017 CT chest FINDINGS: Bones/Joint/Cartilage Flattening of the posterolateral humeral head which could be due to Hill-Sachs impaction. Degenerative glenohumeral spurring with some chronic fragmentation along the posterosuperior glenoid. Chondrocalcinosis of the glenoid labrum. Subacromial morphology is type 2 (curved). Ligaments Suboptimally assessed by CT. Muscles and Tendons Chronic appearing atrophy of the supraspinatus and infraspinatus muscles favoring chronic rotator cuff ruptures. Soft tissues No obvious mass lesions along visualized segment of the brachial plexus. Extensive tumor burden the left chest. Severe emphysema. No obvious regional bony metastatic lesions; the ribs are blurred by motion artifact. Scattered airspace opacities especially in the left upper lobe. IMPRESSION: 1. 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 2. Flattening of the posterolateral humeral head which may be from  remote Hill-Sachs impaction. 3. Extensive tumor burden in the left chest with severe emphysema. 4. Airspace opacity in the left upper lobe, potentially from pneumonia. 5. 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 Mild degenerative glenohumeral spurring. Electronically Signed   By: Van Clines M.D.   On: 09/07/2017 18:25   Ct Biopsy  Result Date: 09/07/2017 CLINICAL DATA:  Large left upper lobe/lingular lung mass with multiple additional bilateral pulmonary masses. EXAM: CT GUIDED CORE BIOPSY OF LEFT LUNG MASS ANESTHESIA/SEDATION: 1.5 mg IV Versed; 50 mcg IV Fentanyl Total Moderate Sedation Time:  10 minutes. The patient's level of consciousness and physiologic status were continuously monitored during the procedure by Radiology nursing. PROCEDURE: The procedure risks, benefits, and alternatives were explained to the patient. Questions regarding the procedure were encouraged and answered. The patient understands and consents to the procedure. A time-out was performed prior to initiating the procedure. CT was performed through the chest in a supine position. The left anterior chest wall was prepped with chlorhexidine in a sterile fashion, and a sterile drape was applied covering the operative field. A sterile gown and sterile gloves were used for the procedure. Local anesthesia was provided with 1% Lidocaine. A 17 gauge trocar needle was advanced into a left lung mass under CT guidance. Three separate coaxial 18 gauge core biopsy samples were obtained and submitted in formalin. Additional CT images were performed. COMPLICATIONS: None FINDINGS: Large left-sided lung mass centered in the lower left upper lobe and extending into the lingula measures approximately 10.8 cm in maximum diameter. Solid tissue was obtained. IMPRESSION: CT-guided biopsy performed of the large left-sided lung mass measuring nearly 11 cm in diameter. Electronically Signed   By: Aletta Edouard M.D.    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   May indeed need intervention.   Overall, I do not feel that he is  a candidate for this given his overall poor health and lung lesion/hemoptysis   Will sign off  Please call with questions.      For questions or updates, please contact [place] Please consult [email or web address] for contact info under Cardiology/STEMI.      Signed, Dorris Carnes, MD  09/09/2017, 11:35 AM

## 2017-09-10 ENCOUNTER — Inpatient Hospital Stay (HOSPITAL_COMMUNITY): Payer: Medicare Other

## 2017-09-10 LAB — BASIC METABOLIC PANEL
Anion gap: 6 (ref 5–15)
CO2: 27 mmol/L (ref 22–32)
Calcium: 8.5 mg/dL — ABNORMAL LOW (ref 8.9–10.3)
Creatinine, Ser: 1.05 mg/dL (ref 0.61–1.24)
GFR calc Af Amer: >60 mL/min (ref >60)
GFR calc non Af Amer: >60 mL/min (ref >60)
Glucose, Bld: 86 mg/dL (ref 70–99)
Sodium: 134 mmol/L — ABNORMAL LOW (ref 135–145)

## 2017-09-10 LAB — CBC
HCT: 22.5 % — ABNORMAL LOW (ref 39.0–52.0)
Hemoglobin: 7.1 g/dL — ABNORMAL LOW (ref 13.0–17.0)
MCH: 24.9 pg — ABNORMAL LOW (ref 26.0–34.0)
MCHC: 31.6 g/dL (ref 30.0–36.0)
MCV: 78.9 fL (ref 78.0–100.0)
Platelets: 276 10*3/uL (ref 150–400)
RBC: 2.85 MIL/uL — ABNORMAL LOW (ref 4.22–5.81)
RDW: 15.4 % (ref 11.5–15.5)
WBC: 7.2 K/uL (ref 4.0–10.5)

## 2017-09-10 LAB — BASIC METABOLIC PANEL WITH GFR
BUN: 17 mg/dL (ref 8–23)
Chloride: 101 mmol/L (ref 98–111)
Potassium: 4.4 mmol/L (ref 3.5–5.1)

## 2017-09-10 LAB — HEMOGLOBIN AND HEMATOCRIT, BLOOD
HCT: 27 % — ABNORMAL LOW (ref 39.0–52.0)
Hemoglobin: 8.4 g/dL — ABNORMAL LOW (ref 13.0–17.0)

## 2017-09-10 LAB — PREPARE RBC (CROSSMATCH)

## 2017-09-10 LAB — PROCALCITONIN: Procalcitonin: 0.18 ng/mL

## 2017-09-10 LAB — MAGNESIUM: Magnesium: 2.3 mg/dL (ref 1.7–2.4)

## 2017-09-10 MED ORDER — SODIUM CHLORIDE 0.9% IV SOLUTION
Freq: Once | INTRAVENOUS | Status: AC
  Administered 2017-09-10: 11:00:00 via INTRAVENOUS

## 2017-09-10 MED ORDER — FERROUS SULFATE 325 (65 FE) MG PO TABS
325.0000 mg | ORAL_TABLET | Freq: Every day | ORAL | Status: DC
  Administered 2017-09-12 – 2017-09-19 (×8): 325 mg via ORAL
  Filled 2017-09-10 (×9): qty 1

## 2017-09-10 MED ORDER — IOPAMIDOL (ISOVUE-370) INJECTION 76%
100.0000 mL | Freq: Once | INTRAVENOUS | Status: AC | PRN
  Administered 2017-09-10: 100 mL via INTRAVENOUS

## 2017-09-10 MED ORDER — IOPAMIDOL (ISOVUE-370) INJECTION 76%
INTRAVENOUS | Status: AC
  Filled 2017-09-10: qty 100

## 2017-09-10 NOTE — Clinical Social Work Note (Signed)
Clinical Social Work Assessment  Patient Details  Name: Corey Dorsey MRN: [id number] Date of Birth: 11-Apr-1940  Date of referral:  09/10/17               Reason for consult:  Facility Placement                Permission sought to share information with:  Facility Sport and exercise psychologist, Family Supports Permission granted to share information::  Yes, Verbal Permission Granted  Name::     Diplomatic Services operational officer::  SNFs  Relationship::  daughter  Contact Information:  [phone]  Housing/Transportation Living arrangements for the past 2 months:  Single Family Home Source of Information:  Patient, Adult Children Patient Interpreter Needed:  None Criminal Activity/Legal Involvement Pertinent to Current Situation/Hospitalization:  No - Comment as needed Significant Relationships:  Adult Children, Other Family Members Lives with:  Other (Comment)(nieces) Do you feel safe going back to the place where you live?  Yes Need for family participation in patient care:  Yes (Comment)  Care giving concerns: Patient from home with nieces. PT recommending SNF.   Social Worker assessment / plan: CSW met with patient at bedside. Patient alert and oriented. CSW introduced self and role and discussed disposition planning/recommendation for SNF. Patient stated he planned to go home with his daughter, Corey Dorsey. Patient agreeable to shrot term SNF and gave permission to discuss disposition planning with Corey Dorsey. CSW spoke to [place] on the phone; she is also agreeable to SNF and prefers a facility close to her in [facility]. Daughter also with questions about results of patient's biopsy. CSW faxed out referrals, awaiting bed offers. Will provide offers when available. CSW to follow and support with discharge planning.  Patient Employment status:  Retired Forensic scientist:  Medicare PT Recommendations:  [place] / Referral to community resources:  [place]  Patient/Family's Response to care: Patient and daughter appreciative of care.  Patient/Family's Understanding of and Emotional Response to Diagnosis, Current Treatment, and Prognosis: Patient and daughter with understanding of patient's condition. Daughter has questions about results of biopsy. Agreeable to SNF.  Emotional Assessment Appearance:  Appears stated age Attitude/Demeanor/Rapport:  Engaged Affect (typically observed):  Accepting, Appropriate, Calm, Pleasant Orientation:  Oriented to Self, Oriented to Place, Oriented to  Time, Oriented to Situation Alcohol / Substance use:  Not Applicable Psych involvement (Current and /or in the community):  No (Comment)  Discharge Needs  Concerns to be addressed:  Discharge Planning Concerns, Care Coordination Readmission within the last 30 days:  No Current discharge risk:  Physical Impairment Barriers to Discharge:  Continued Medical Work up   Estanislado Emms, LCSW 09/10/2017, 2:28 PM

## 2017-09-10 NOTE — Progress Notes (Signed)
PROGRESS NOTE    Corey Dorsey  BMW:[id number] DOB: [birth year] DOA: 09/05/2017 PCP: Golden [facility], FNP    Brief Narrative:  77 year old with no medical history admitted after experiencing anterior chest pain and pleuritic chest pain and found to have elevated troponin lung mass with multiple metastases who is status post biopsy on 09/07/2017.   Assessment & Plan:   Principal Problem:   Lung mass Active Problems:   Tobacco use disorder   Shortness of breath   Elevated troponin   Microcytic hypochromic anemia   #) Hypoxia: Likely secondary to underlying chronic lung disease from smoking as well as tumor compression of parenchyma however on chest x-ray today his tumor appears to have grown larger since the x-ray approximately 5 days ago.  This rapid progression suggests more likely that he has a hematoma secondary to the CT-guided biopsy on 09/07/2017.  Additionally his hemoptysis continues unabated.  He is also quite anemic and requires transfusion today.  Interestingly enough he has bilateral pulmonary opacities that are concerning for infection versus pneumonitis versus meningitic spread. 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  Per above patient continues to have hemoptysis -Outpatient follow-up with oncology  #) Elevated troponin: Echo showed only grade 1 diastolic dysfunction.  CT scan of the chest however did show significant calcific disease.  Unfortunately per cardiology do not feel like he is a very good candidate for intervention at this time particularly with the hemoptysis.  #) Anemia: Likely secondary to anemia chronic disease/iron deficiency anemia with long-standing cancer however with hemoptysis this is more concerning for acute blood loss anemia. -Transfuse 1 unit packed red blood cells today -Iron studies are consistent with iron deficiency anemia -Start ferrous  sulfate 325 daily  Fluids: Tolerating p.o. Elect lites: Monitor and supplement Nutrition: Regular diet  Prophylaxis: SCDs  Disposition: Pending weaning of oxygen versus discharge home on oxygen  Full code  Consultants:   Pulmonology  Interventional radiology  Oncology  Procedures:   CT-guided biopsy of lung mass on 09/07/2017  Antimicrobials:   None   Subjective: Patient reports he is doing fairly well.  He continues to have no dyspnea or shortness of breath but could not be weaned off oxygen yesterday.  He also continues to have small volume hemoptysis.  Objective: Vitals:   09/10/17 0106 09/10/17 0638 09/10/17 1116 09/10/17 1132  BP:  102/72 99/68 105/69  Pulse:  75 91 87  Resp:  18 18 18   Temp:  97.8 F (36.6 C) 98 F (36.7 C) 98.2 F (36.8 C)  TempSrc:  Oral Oral Oral  SpO2: 92% 95% 90% 93%  Weight:  55.8 kg (123 lb)    Height:        Intake/Output Summary (Last 24 hours) at 09/10/2017 1204 Last data filed at 09/10/2017 1117 Gross per 24 hour  Intake 870 ml  Output 850 ml  Net 20 ml   Filed Weights   09/08/17 0508 09/09/17 0552 09/10/17 2725  Weight: 56.2 kg (123 lb 14.4 oz) 56.5 kg (124 lb 9 oz) 55.8 kg (123 lb)    Examination:  General exam: No acute distress Respiratory system: Diminished lung sounds throughout, no wheezes, rhonchi, rales. Cardiovascular system: Regular rate and rhythm, no murmurs Gastrointestinal system: Abdomen is nondistended, soft and nontender. No organomegaly or masses felt. Normal bowel sounds heard. Central nervous system: Alert and oriented. No focal neurological deficits. Extremities: No lower  extremity edema Skin: No rashes visible skin Psychiatry: Judgement and insight appear normal. Mood & affect appropriate.     Data Reviewed: I have personally reviewed following labs and imaging studies  CBC: Recent Labs  Lab 09/05/17 1429 09/06/17 0459 09/10/17 0541  WBC 7.2 6.2 7.2  NEUTROABS 5.5  --   --   HGB  9.8* 8.2* 7.1*  HCT 30.9* 26.4* 22.5*  MCV 77.8* 79.5 78.9  PLT 292 267 150   Basic Metabolic Panel: Recent Labs  Lab 09/05/17 1429 09/06/17 0459 09/07/17 0526 09/10/17 0541  NA 131* 136 133* 134*  K 3.9 4.3 4.5 4.4  CL 102 104 102 101  CO2 21* 26 23 27   GLUCOSE 85 89 76 86  BUN 11 12 15 17   CREATININE 1.08 1.03 0.99 1.05  CALCIUM 8.4* 8.4* 8.5* 8.5*  MG  --   --   --  2.3   GFR: Estimated Creatinine Clearance: 45.8 mL/min (by C-G formula based on SCr of 1.05 mg/dL). Liver Function Tests: Recent Labs  Lab 09/05/17 1429  AST 23  ALT 10  ALKPHOS 48  BILITOT 0.7  PROT 7.9  ALBUMIN 2.6*   No results for input(s): LIPASE, AMYLASE in the last 168 hours. No results for input(s): AMMONIA in the last 168 hours. Coagulation Profile: Recent Labs  Lab 09/05/17 1708  INR 1.05   Cardiac Enzymes: Recent Labs  Lab 09/05/17 1429 09/05/17 2312 09/06/17 0459 09/06/17 1117  TROPONINI 1.05* 1.13* 1.04* 0.97*   BNP (last 3 results) No results for input(s): PROBNP in the last 8760 hours. HbA1C: No results for input(s): HGBA1C in the last 72 hours. CBG: No results for input(s): GLUCAP in the last 168 hours. Lipid Profile: No results for input(s): CHOL, HDL, LDLCALC, TRIG, CHOLHDL, LDLDIRECT in the last 72 hours. Thyroid Function Tests: No results for input(s): TSH, T4TOTAL, FREET4, T3FREE, THYROIDAB in the last 72 hours. Anemia Panel: No results for input(s): VITAMINB12, FOLATE, FERRITIN, TIBC, IRON, RETICCTPCT in the last 72 hours. Sepsis Labs: No results for input(s): PROCALCITON, LATICACIDVEN in the last 168 hours.  No results found for this or any previous visit (from the past 240 hour(s)).       Radiology Studies: Dg Chest Port 1 View  Result Date: 09/10/2017 CLINICAL DATA:  Continued small amount of hemoptysis. Status post left lung mass biopsy on 09/07/2017. EXAM: PORTABLE CHEST 1 VIEW COMPARISON:  09/05/2017, chest CTA dated 09/05/2017 and CT guided left  lung biopsy dated 09/07/2017. FINDINGS: The previously demonstrated large left lung mass appears larger and more confluent than on 09/05/2017. Multiple smaller masses are again demonstrated elsewhere in both lungs. Interval patchy densities in both lungs. This appears predominantly interstitial with some airspace component. No pneumothorax. Mild scoliosis. Diffuse osteopenia. Normal sized heart. IMPRESSION: 1. Interval increase in size and density of the previously demonstrated large left lung mass. 2. Multiple smaller bilateral lung masses compatible with metastases are again demonstrated. 3. Interval patchy opacities in both lungs. Differential considerations include pneumonia, pneumonitis, pulmonary edema, lymphangitic spread of tumor and hemorrhage. Electronically Signed   By: Claudie Revering M.D.   On: 09/10/2017 09:15        Scheduled Meds: . sodium chloride   Intravenous Once  . aspirin EC  81 mg Oral Daily   Continuous Infusions:   LOS: 5 days    Time spent: [place], MD [facility]  If 7PM-7AM, please contact night-coverage [email or web address] Password [facility] 09/10/2017, 12:04 PM

## 2017-09-10 NOTE — NC FL2 (Addendum)
[place] LEVEL OF CARE SCREENING TOOL     IDENTIFICATION  Patient Name: Corey Dorsey Birthdate: 1940-11-30 Sex: male Admission Date (Current Location): 09/05/2017  [facility] and [place] Number:  Herbalist and Address:  [facility],  [place] [address] [place]      Provider Number: [id number]  Attending Physician Name and Address:  Cristy Folks, MD  Relative Name and Phone Number:  [name] daughter - [phone]    Current Level of Care: Hospital Recommended Level of Care: [place] Prior Approval Number:    Date Approved/Denied:   PASRR Number:   [id number]  Discharge Plan: SNF    Current Diagnoses: Patient Active Problem List   Diagnosis Date Noted  . Lung mass 09/05/2017  . Shortness of breath 09/05/2017  . Elevated troponin 09/05/2017  . Microcytic hypochromic anemia 09/05/2017  . Mass of abdomen 11/15/2016  . Elevated LDL cholesterol level 11/15/2016  . Dental abscess 05/09/2015  . Medicare annual wellness visit, subsequent 03/31/2015  . Tobacco use disorder 03/31/2015  . Hearing loss     Orientation RESPIRATION BLADDER Height & Weight     Self, Time, Situation, Place  O2(4L) Continent Weight: 123 lb (55.8 kg) Height:  5\' 8"  (172.7 cm)  BEHAVIORAL SYMPTOMS/MOOD NEUROLOGICAL BOWEL NUTRITION STATUS      Continent Diet  AMBULATORY STATUS COMMUNICATION OF NEEDS Skin   Extensive Assist Verbally Normal                       Personal Care Assistance Level of Assistance  Bathing, Dressing Bathing Assistance: Maximum assistance   Dressing Assistance: Maximum assistance     Functional Limitations Info  Hearing, Speech, Sight Sight Info: Adequate Hearing Info: Impaired Speech Info: Adequate    SPECIAL CARE FACTORS FREQUENCY  PT (By licensed PT), OT (By licensed OT)     PT Frequency: 5x/week OT Frequency: 5x/week            Contractures Contractures Info: Not present     Additional Factors Info  Code Status, Allergies Code Status Info: Full Allergies Info: NKA           Current Medications (09/10/2017):  This is the current hospital active medication list Current Facility-Administered Medications  Medication Dose Route Frequency Provider Last Rate Last Dose  . 0.9 %  sodium chloride infusion (Manually program via Guardrails IV Fluids)   Intravenous Once Purohit, Shrey C, MD      . acetaminophen (TYLENOL) tablet 650 mg  650 mg Oral Q6H PRN Rise Patience, MD   650 mg at 09/08/17 1722   Or  . acetaminophen (TYLENOL) suppository 650 mg  650 mg Rectal Q6H PRN Rise Patience, MD      . aspirin EC tablet 81 mg  81 mg Oral Daily Fay Records, MD   81 mg at 09/10/17 1035  . [START ON 09/11/2017] ferrous sulfate tablet 325 mg  325 mg Oral Q breakfast Purohit, Shrey C, MD      . ondansetron (ZOFRAN) tablet 4 mg  4 mg Oral Q6H PRN Rise Patience, MD       Or  . ondansetron [facility]) injection 4 mg  4 mg Intravenous Q6H PRN Rise Patience, MD      . oxyCODONE (Oxy IR/ROXICODONE) immediate release tablet 5 mg  5 mg Oral Q4H PRN Reyne Dumas, MD   5 mg at 09/10/17 0553  . zolpidem (AMBIEN) tablet  5 mg  5 mg Oral QHS PRN Gardiner Barefoot, NP   5 mg at 09/09/17 2132     Discharge Medications: Please see discharge summary for a list of discharge medications.  Relevant Imaging Results:  Relevant Lab Results:   Additional Information SSN: [id number]  Pricilla Holm, [place]

## 2017-09-11 LAB — TYPE AND SCREEN
ABO/RH(D): O POS
Antibody Screen: NEGATIVE
Unit division: 0

## 2017-09-11 LAB — BPAM RBC
Blood Product Expiration Date: 201908172359
ISSUE DATE / TIME: 201907211104
Unit Type and Rh: 5100

## 2017-09-11 LAB — PROCALCITONIN: Procalcitonin: 0.2 ng/mL

## 2017-09-11 LAB — CBC
HCT: 26.3 % — ABNORMAL LOW (ref 39.0–52.0)
Hemoglobin: 8.2 g/dL — ABNORMAL LOW (ref 13.0–17.0)
MCH: 24.7 pg — ABNORMAL LOW (ref 26.0–34.0)
MCHC: 31.2 g/dL (ref 30.0–36.0)
MCV: 79.2 fL (ref 78.0–100.0)
Platelets: 293 10*3/uL (ref 150–400)
RBC: 3.32 MIL/uL — ABNORMAL LOW (ref 4.22–5.81)
RDW: 15.7 % — ABNORMAL HIGH (ref 11.5–15.5)
WBC: 8.4 10*3/uL (ref 4.0–10.5)

## 2017-09-11 MED ORDER — SODIUM CHLORIDE 0.9 % IV SOLN
2.0000 g | Freq: Two times a day (BID) | INTRAVENOUS | Status: DC
  Administered 2017-09-11 – 2017-09-14 (×8): 2 g via INTRAVENOUS
  Filled 2017-09-11 (×9): qty 2

## 2017-09-11 MED ORDER — LIDOCAINE 5 % EX PTCH
1.0000 | MEDICATED_PATCH | CUTANEOUS | Status: DC
  Administered 2017-09-11 – 2017-09-19 (×9): 1 via TRANSDERMAL
  Filled 2017-09-11 (×9): qty 1

## 2017-09-11 NOTE — Progress Notes (Signed)
Physical Therapy Treatment Patient Details Name: Corey Dorsey MRN: [id number] DOB: 1940/11/18 Today's Date: 09/11/2017    History of Present Illness 77 y.o. male with no significant past medical history and has not been to a doctor for many years started experiencing chest pain in the left anterior chest wall for the last few weeks.  Chest pain is present even at rest and increased on deep inspiration.  Has been examined productive cough sometimes blood-tinged mostly in the mornings.  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 Pt had CT-guided biopsy of Left lung mass on 09/07/17    PT Comments    Progressing slowly with mobility. Pt had coughing spells intermittently during session. Productive cough-sputum with blood visible. O2 sat: >or= 90% on 4L [place] at rest, 88% on 4L [place] during ambulation. Will continue to follow and progress activity as tolerated.    Follow Up Recommendations  SNF     Equipment Recommendations  Rolling walker with 5" wheels    Recommendations for Other Services       Precautions / Restrictions Precautions Precautions: Fall Precaution Comments: monitor oxygen Restrictions Weight Bearing Restrictions: No    Mobility  Bed Mobility Overal bed mobility: Needs Assistance Bed Mobility: Supine to Sit;Sit to Supine     Supine to sit: Min guard;HOB elevated Sit to supine: Min guard;HOB elevated   General bed mobility comments: close guard for safety. increased time.   Transfers Overall transfer level: Needs assistance Equipment used: Rolling walker (2 wheeled) Transfers: Sit to/from Stand Sit to Stand: Min assist;From elevated surface         General transfer comment: assist to rise, steady, control descent. Pt agreeable to RW on today. brief coughing spell initially.   Ambulation/Gait Ambulation/Gait assistance: Min assist Gait Distance (Feet): 55 Feet Assistive device: Rolling walker (2  wheeled) Gait Pattern/deviations: Step-through pattern;Decreased stride length     General Gait Details: intermittent assist to steady and maneuver with RW. Slow gait speed. O2 sat 88% on 4L [place] O2, dyspnea 2/4.   Stairs             Wheelchair Mobility    Modified Rankin (Stroke Patients Only)       Balance Overall balance assessment: Needs assistance         Standing balance support: Bilateral upper extremity supported Standing balance-Leahy Scale: Poor                              Cognition Arousal/Alertness: Awake/alert Behavior During Therapy: WFL for tasks assessed/performed Overall Cognitive Status: Within Functional Limits for tasks assessed                                 General Comments: a bit ornery at times      Exercises      General Comments        Pertinent Vitals/Pain Pain Assessment: Faces Faces Pain Scale: Hurts little more Pain Location: chest from coughing Pain Descriptors / Indicators: Sore Pain Intervention(s): Monitored during session;Repositioned    Home Living                      Prior Function            PT Goals (current goals can now be found in the care plan section) Progress towards PT goals:  Progressing toward goals    Frequency    Min 2X/week      PT Plan Current plan remains appropriate    Co-evaluation              AM-PAC PT "6 Clicks" Daily Activity  Outcome Measure  Difficulty turning over in bed (including adjusting bedclothes, sheets and blankets)?: A Little Difficulty moving from lying on back to sitting on the side of the bed? : A Little Difficulty sitting down on and standing up from a chair with arms (e.g., wheelchair, bedside commode, etc,.)?: Unable Help needed moving to and from a bed to chair (including a wheelchair)?: A Little Help needed walking in hospital room?: A Little Help needed climbing 3-5 steps with a railing? : A Lot 6 Click Score:  15    End of Session Equipment Utilized During Treatment: Gait belt;Oxygen Activity Tolerance: Patient tolerated treatment well Patient left: in bed;with call bell/phone within reach;with bed alarm set         Time: 1157-2620 PT Time Calculation (min) (ACUTE ONLY): 29 min  Charges:  $Gait Training: 8-22 mins $Therapeutic Activity: 8-22 mins                    G Codes:         Weston Anna, MPT Pager: [phone]

## 2017-09-11 NOTE — Progress Notes (Signed)
Referring Physician(s): Marni Griffon, NP  Supervising Physician: Marybelle Killings  Patient Status:  [facility] - In-pt  Chief Complaint: Lung mass, hemoptysis   Subjective: Patient s/p lung mass biopsy 09/07/17.  Patient continues with persistent hemoptysis.  He did have blood transfusion yesterday for drop in hemoglobin.  IR reconsulted for eval and management of hemoptysis.   Allergies: Patient has no known allergies.  Medications: Prior to Admission medications   Medication Sig Start Date End Date Taking? Authorizing Provider  acetaminophen (TYLENOL) 500 MG tablet Take 500 mg by mouth daily as needed for mild pain.   Yes [provider]  naproxen sodium (ALEVE) 220 MG tablet Take 220 mg by mouth daily as needed (pain).   Yes [provider]     Vital Signs: BP 113/74 (BP Location: Left Arm)   Pulse 99   Temp 99 F (37.2 C) (Oral)   Resp 20   Ht 5\' 8"  (1.727 m)   Wt 123 lb 10.9 oz (56.1 kg)   SpO2 93%   BMI 18.81 kg/m   Physical Exam  Constitutional: He is oriented to person, place, and time. He appears well-developed.  Neck: Normal range of motion. Neck supple. No tracheal deviation present.  Cardiovascular: Normal rate, regular rhythm and normal heart sounds. Exam reveals no gallop and no friction rub.  No murmur heard. Pulmonary/Chest: Effort normal. No respiratory distress.  Diminished breath sounds bilaterally. Biopsy site left anterior chest I/c/d.  Neurological: He is alert and oriented to person, place, and time.  Skin: Skin is warm and dry.  Psychiatric: He has a normal mood and affect. His behavior is normal. Judgment and thought content normal.  Nursing note and vitals reviewed.   Imaging: Ct Angio Chest Pe W Or Wo Contrast  Result Date: 09/10/2017 CLINICAL DATA:  Hemoptysis.  Multiple pulmonary tumors. EXAM: CT ANGIOGRAPHY CHEST WITH CONTRAST TECHNIQUE: Multidetector CT imaging of the chest was performed using the standard protocol  during bolus administration of intravenous contrast. Multiplanar CT image reconstructions and MIPs were obtained to evaluate the vascular anatomy. CONTRAST:  166mL ISOVUE-370 IOPAMIDOL (ISOVUE-370) INJECTION 76% COMPARISON:  CT-guided biopsy 09/07/2017 Chest CTA 09/05/2017 FINDINGS: Cardiovascular: --Pulmonary arteries: Contrast injection is sufficient to demonstrate satisfactory opacification of the pulmonary arteries to the segmental level. There is no pulmonary embolus. The main pulmonary artery is within normal limits for size. --Aorta: Satisfactory opacification of the thoracic aorta. No aortic dissection or other acute aortic syndrome. Conventional 3 vessel aortic branching pattern. The aortic course and caliber are normal. There is mild aortic atherosclerosis. --Heart: Normal size. No pericardial effusion. Mediastinum/Nodes: Level 4R nodes measure up to 12 mm. Level 6 nodes measure up to 10 mm. Visualized thyroid is normal. Normal course of the esophagus. No axillary lymphadenopathy. Lungs/Pleura: The dominant mass within the left upper lobe measures 10.7 x 8.4 cm, unchanged in size. The mass encases multiple pulmonary arterial branches. There are numerous other pulmonary nodules scattered throughout both lungs. The largest nodule in the right measures 2.9 cm. Small left pleural effusion. Multiple areas of reticular opacity within both lungs has worsened, most notably in the right upper and middle lobes and the left upper lobe. The lingual bronchus is severely narrowed by the dominant mass, but unchanged from the prior study. Upper Abdomen: Contrast bolus timing is not optimized for evaluation of the abdominal organs. Within this limitation, the visualized organs of the upper abdomen are normal. Musculoskeletal: No chest wall abnormality. No acute or significant osseous findings. Review  of the MIP images confirms the above findings. IMPRESSION: 1. No pulmonary embolus. 2. Unchanged size of dominant left upper  lobe mass measuring up to 10.7 cm, narrowing the lingular bronchus and encasing multiple small pulmonary artery branches. This is a probable source of hemoptysis. 3. Numerous bilateral pulmonary metastatic nodules, which are also potential sources of hemoptysis. 4.  Aortic Atherosclerosis (ICD10-I70.0). Electronically Signed   By: Ulyses Jarred M.D.   On: 09/10/2017 17:04   Ct Shoulder Left Wo Contrast  Result Date: 09/07/2017 CLINICAL DATA:  Left shoulder pain EXAM: CT OF THE UPPER LEFT EXTREMITY WITHOUT CONTRAST TECHNIQUE: Multidetector CT imaging of the upper left extremity was performed according to the standard protocol. COMPARISON:  09/05/2017 CT chest FINDINGS: Bones/Joint/Cartilage Flattening of the posterolateral humeral head which could be due to Hill-Sachs impaction. Degenerative glenohumeral spurring with some chronic fragmentation along the posterosuperior glenoid. Chondrocalcinosis of the glenoid labrum. Subacromial morphology is type 2 (curved). Ligaments Suboptimally assessed by CT. Muscles and Tendons Chronic appearing atrophy of the supraspinatus and infraspinatus muscles favoring chronic rotator cuff ruptures. Soft tissues No obvious mass lesions along visualized segment of the brachial plexus. Extensive tumor burden the left chest. Severe emphysema. No obvious regional bony metastatic lesions; the ribs are blurred by motion artifact. Scattered airspace opacities especially in the left upper lobe. IMPRESSION: 1. 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 2. Flattening of the posterolateral humeral head which may be from remote Hill-Sachs impaction. 3. Extensive tumor burden in the left chest with severe emphysema. 4. Airspace opacity in the left upper lobe, potentially from pneumonia. 5. 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 Mild degenerative glenohumeral spurring.  Electronically Signed   By: Van Clines M.D.   On: 09/07/2017 18:25   Ct Biopsy  Result Date: 09/07/2017 CLINICAL DATA:  Large left upper lobe/lingular lung mass with multiple additional bilateral pulmonary masses. EXAM: CT GUIDED CORE BIOPSY OF LEFT LUNG MASS ANESTHESIA/SEDATION: 1.5 mg IV Versed; 50 mcg IV Fentanyl Total Moderate Sedation Time:  10 minutes. The patient's level of consciousness and physiologic status were continuously monitored during the procedure by Radiology nursing. PROCEDURE: The procedure risks, benefits, and alternatives were explained to the patient. Questions regarding the procedure were encouraged and answered. The patient understands and consents to the procedure. A time-out was performed prior to initiating the procedure. CT was performed through the chest in a supine position. The left anterior chest wall was prepped with chlorhexidine in a sterile fashion, and a sterile drape was applied covering the operative field. A sterile gown and sterile gloves were used for the procedure. Local anesthesia was provided with 1% Lidocaine. A 17 gauge trocar needle was advanced into a left lung mass under CT guidance. Three separate coaxial 18 gauge core biopsy samples were obtained and submitted in formalin. Additional CT images were performed. COMPLICATIONS: None FINDINGS: Large left-sided lung mass centered in the lower left upper lobe and extending into the lingula measures approximately 10.8 cm in maximum diameter. Solid tissue was obtained. IMPRESSION: CT-guided biopsy performed of the large left-sided lung mass measuring nearly 11 cm in diameter. Electronically Signed   By: Aletta Edouard M.D.   On: 09/07/2017 17:22   Dg Chest Port 1 View  Result Date: 09/10/2017 CLINICAL DATA:  Continued small amount of hemoptysis. Status post left lung mass biopsy on 09/07/2017. EXAM: PORTABLE CHEST 1 VIEW COMPARISON:  09/05/2017, chest CTA dated 09/05/2017 and CT guided left  lung biopsy  dated 09/07/2017. FINDINGS: The previously demonstrated large left lung mass appears larger and more confluent than on 09/05/2017. Multiple smaller masses are again demonstrated elsewhere in both lungs. Interval patchy densities in both lungs. This appears predominantly interstitial with some airspace component. No pneumothorax. Mild scoliosis. Diffuse osteopenia. Normal sized heart. IMPRESSION: 1. Interval increase in size and density of the previously demonstrated large left lung mass. 2. Multiple smaller bilateral lung masses compatible with metastases are again demonstrated. 3. Interval patchy opacities in both lungs. Differential considerations include pneumonia, pneumonitis, pulmonary edema, lymphangitic spread of tumor and hemorrhage. Electronically Signed   By: Claudie Revering M.D.   On: 09/10/2017 09:15    Labs:  CBC: Recent Labs    09/05/17 1429 09/06/17 0459 09/10/17 0541 09/10/17 1707 09/11/17 0509  WBC 7.2 6.2 7.2  --  8.4  HGB 9.8* 8.2* 7.1* 8.4* 8.2*  HCT 30.9* 26.4* 22.5* 27.0* 26.3*  PLT 292 267 276  --  293    COAGS: Recent Labs    09/05/17 1708  INR 1.05  APTT 32    BMP: Recent Labs    09/05/17 1429 09/06/17 0459 09/07/17 0526 09/10/17 0541  NA 131* 136 133* 134*  K 3.9 4.3 4.5 4.4  CL 102 104 102 101  CO2 21* 26 23 27   GLUCOSE 85 89 76 86  BUN 11 12 15 17   CALCIUM 8.4* 8.4* 8.5* 8.5*  CREATININE 1.08 1.03 0.99 1.05  GFRNONAA >60 >60 >60 >60  GFRAA >60 >60 >60 >60    LIVER FUNCTION TESTS: Recent Labs    11/17/16 0923 09/05/17 1429  BILITOT 1.0 0.7  AST 22 23  ALT 16 10  ALKPHOS 64 48  PROT 7.5 7.9  ALBUMIN 4.0 2.6*    Assessment and Plan: Hemoptysis S/p lung biopsy 09/07/17  Patient with hemoptysis and recent lung mass biopsy. IR consulted for evaluation and management due to recent drop in hemoglobin.  Both patient and daughter tell me today that he has been coughing up blood for several weeks, possibly months.  Note that on admission,  this was also reported as present PTA.  He is coughing up small amount of blood-tinged sputum, no hemorrhage.  Biopsy site was very lateral and no complications were noted at the time of the procedure.  CTA Chest was performed yesterday noting large lung mass with significant pulmonary artery involvement/disease erosion.  Question whether an angiogram would be diagnostic as patient with only small amount of hemoptysis- apparently there was a larger amount yesterday.   Bronchoscopy could confirm which side is bleeding.  Per Dr. Herbert Moors, pulmonology has been re-consulted.  Discussed merits of empiric embolization with Dr. Barbie Banner who recommends pursue bronchoscopy with pulmonology, if appropriate, prior to consideration as patient with bilateral extensive disease and empiric embolization may not resolve hemoptysis.  IR to follow.   Electronically Signed: Docia Barrier, PA 09/11/2017, 11:45 AM   I spent a total of 15 Minutes at the the patient's bedside AND on the patient's hospital floor or unit, greater than 50% of which was counseling/coordinating care for hemoptysis.

## 2017-09-11 NOTE — Progress Notes (Signed)
Pt resting quietly. Pt's temperature 98.6. Maintain current plan of care.

## 2017-09-11 NOTE — Progress Notes (Addendum)
PHARMACY NOTE:  ANTIMICROBIAL RENAL DOSAGE ADJUSTMENT  Current antimicrobial regimen includes a mismatch between antimicrobial dosage and estimated renal function. As per policy approved by the Pharmacy & Therapeutics and Medical Executive Committees, the antimicrobial dosage will be adjusted accordingly.  Current antimicrobial and dosage:  Cefepime  2 g q8 hr  Indication: HCAP  Renal Function:   Estimated Creatinine Clearance: 46 mL/min (by C-G formula based on SCr of 1.05 mg/dL). []      On intermittent HD, scheduled: []      On CRRT    Antimicrobial dosage has been changed to:  Cefepime 2g IV q12 hr   Additional Comments: none   Thank you for allowing pharmacy to be a part of this patient's care.  Reuel Boom, PharmD, BCPS [phone] 09/11/2017, 8:09 AM

## 2017-09-11 NOTE — Progress Notes (Signed)
MD updated vis phone regarding Pt's temperature of 100.2 BP 103/66 Pulse 77. There are no other acute changes noted with Pt's assessment and Pt denies any complaints of pain or other needs. Medicated with Tylenol for temperature. Maintain current plan of care and monitor Pt closely.

## 2017-09-11 NOTE — Clinical Social Work Placement (Signed)
   CLINICAL SOCIAL WORK PLACEMENT  NOTE  Date:  09/11/2017  Patient Details  Name: Corey Dorsey MRN: [id number] Date of Birth: [birth year]  Clinical Social Work is seeking post-discharge placement for this patient at the [place] level of care (*CSW will initial, date and re-position this form in  chart as items are completed):  Yes   Patient/family provided with [place] Work Department's list of facilities offering this level of care within the geographic area requested by the patient (or if unable, by the patient's family).  Yes   Patient/family informed of their freedom to choose among providers that offer the needed level of care, that participate in Medicare, Medicaid or managed care program needed by the patient, have an available bed and are willing to accept the patient.  Yes   Patient/family informed of [place] [facility]'s ownership interest in [facility], as well as of the fact that they are under no obligation to receive care at these facilities.  PASRR submitted to EDS on 09/10/17     PASRR number received on 09/10/17     Existing PASRR number confirmed on       FL2 transmitted to all facilities in geographic area requested by pt/family on 09/10/17     FL2 transmitted to all facilities within larger geographic area on       Patient informed that his/her managed care company has contracts with or will negotiate with certain facilities, including the following:        Yes   Patient/family informed of bed offers received.  Patient chooses bed at       Physician recommends and patient chooses bed at      Patient to be transferred to   on  .  Patient to be transferred to facility by       Patient family notified on   of transfer.  Name of family member notified:        PHYSICIAN       Additional Comment:    _______________________________________________ Burnis Medin, LCSW 09/11/2017, 9:53 AM

## 2017-09-11 NOTE — Progress Notes (Signed)
PROGRESS NOTE    Corey Dorsey  QIO:[id number] DOB: [birth year] DOA: 09/05/2017 PCP: [facility], FNP    Brief Narrative:  77 year old with no medical history admitted after experiencing anterior chest pain and pleuritic chest pain and found to have elevated troponin lung mass with multiple metastases who is status post biopsy on 09/07/2017.   Assessment & Plan:   Principal Problem:   Lung mass Active Problems:   Tobacco use disorder   Shortness of breath   Elevated troponin   Microcytic hypochromic anemia   #) Fever secondary to pneumonia: Repeat CTA yesterday showed worsening opacities and patient had fever to 101 last night.  Suspect most likely patient has pneumonia.  Due to his duration of stay it is likely hospital-acquired and/or postobstructive pneumonia.  Additionally it could very well simply be due to tumor burden and/or aspirated blood however he is quite febrile.  Suspect he will need a short course of antibiotics as his procalcitonin is quite low and can be rapidly converted to oral antibiotics when he defervesced his - IV cefepime started 09/10/2017 -Follow blood cultures ordered 09/10/2017  #) Hemoptysis: Patient continues to have small volumes of hemoptysis.  He did require blood transfusion after he dropped a gram on 09/10/2017 however his hemoglobin is currently stable.  He reports that the hemoptysis occurred only after the biopsy however his daughter and other people are saying that the hemoptysis occurred and has been occurring for weeks prior. 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  Per above patient continues to have hemoptysis -Outpatient follow-up with  oncology  #) Elevated troponin: Echo showed only grade 1 diastolic dysfunction.  CT scan of the chest however did show significant calcific disease.  Unfortunately per cardiology do not feel like he is a very good candidate for intervention at this time particularly with the hemoptysis and limited life expectancy.  #) Iron deficiency anemia: Likely secondary to anemia chronic disease/iron deficiency anemia with long-standing cancer along with chronic losses from small volume hemoptysis -Transfuse 1 unit packed red blood cells on 09/10/2017 -Continue ferrous sulfate 325 daily  Fluids: Tolerating p.o. Elect lites: Monitor and supplement Nutrition: Regular diet  Prophylaxis: SCDs  Disposition: Pending resolution of fever n  Full code  Consultants:   Pulmonology  Interventional radiology  Oncology  Procedures:   CT-guided biopsy of lung mass on 09/07/2017  Antimicrobials:   None   Subjective: Patient reports he is doing fairly well.  He continues to report small volume hemoptysis.  He was seen by interventional radiology who reportedly wanted to discuss with pulmonary about possible bronchoscopy to see where hemoptysis was coming from so could be better localized.  They do not feel the patient currently would benefit from: Mobilization as they are not sure that would be able to embolize the area where the bleeding is coming from.  Objective: Vitals:   09/10/17 2336 09/11/17 0515 09/11/17 1343 09/11/17 1343  BP:  113/74  103/66  Pulse:  99  97  Resp:  20  16  Temp: 99.1 F (37.3 C) 99 F (37.2 C) 100.2 F (37.9 C) 99.1 F (37.3 C)  TempSrc: Oral Oral Axillary Oral  SpO2:  93%  91%  Weight:  56.1  kg (123 lb 10.9 oz)    Height:        Intake/Output Summary (Last 24 hours) at 09/11/2017 1449 Last data filed at 09/11/2017 1349 Gross per 24 hour  Intake 800 ml  Output 700 ml  Net 100 ml   Filed Weights   09/09/17 0552 09/10/17 0638 09/11/17 0515  Weight: 56.5 kg (124  lb 9 oz) 55.8 kg (123 lb) 56.1 kg (123 lb 10.9 oz)    Examination:  General exam: No acute distress Respiratory system: Diminished lung sounds throughout, no wheezes, rhonchi, rales. Cardiovascular system: Regular rate and rhythm, no murmurs Gastrointestinal system: Abdomen is nondistended, soft and nontender. No organomegaly or masses felt. Normal bowel sounds heard. Central nervous system: Alert and oriented. No focal neurological deficits. Extremities: No lower extremity edema Skin: No rashes visible skin Psychiatry: Judgement and insight appear normal. Mood & affect appropriate.     Data Reviewed: I have personally reviewed following labs and imaging studies  CBC: Recent Labs  Lab 09/05/17 1429 09/06/17 0459 09/10/17 0541 09/10/17 1707 09/11/17 0509  WBC 7.2 6.2 7.2  --  8.4  NEUTROABS 5.5  --   --   --   --   HGB 9.8* 8.2* 7.1* 8.4* 8.2*  HCT 30.9* 26.4* 22.5* 27.0* 26.3*  MCV 77.8* 79.5 78.9  --  79.2  PLT 292 267 276  --  097   Basic Metabolic Panel: Recent Labs  Lab 09/05/17 1429 09/06/17 0459 09/07/17 0526 09/10/17 0541  NA 131* 136 133* 134*  K 3.9 4.3 4.5 4.4  CL 102 104 102 101  CO2 21* 26 23 27   GLUCOSE 85 89 76 86  BUN 11 12 15 17   CREATININE 1.08 1.03 0.99 1.05  CALCIUM 8.4* 8.4* 8.5* 8.5*  MG  --   --   --  2.3   GFR: Estimated Creatinine Clearance: 46 mL/min (by C-G formula based on SCr of 1.05 mg/dL). Liver Function Tests: Recent Labs  Lab 09/05/17 1429  AST 23  ALT 10  ALKPHOS 48  BILITOT 0.7  PROT 7.9  ALBUMIN 2.6*   No results for input(s): LIPASE, AMYLASE in the last 168 hours. No results for input(s): AMMONIA in the last 168 hours. Coagulation Profile: Recent Labs  Lab 09/05/17 1708  INR 1.05   Cardiac Enzymes: Recent Labs  Lab 09/05/17 1429 09/05/17 2312 09/06/17 0459 09/06/17 1117  TROPONINI 1.05* 1.13* 1.04* 0.97*   BNP (last 3 results) No results for input(s): PROBNP in the last 8760 hours. HbA1C: No results  for input(s): HGBA1C in the last 72 hours. CBG: No results for input(s): GLUCAP in the last 168 hours. Lipid Profile: No results for input(s): CHOL, HDL, LDLCALC, TRIG, CHOLHDL, LDLDIRECT in the last 72 hours. Thyroid Function Tests: No results for input(s): TSH, T4TOTAL, FREET4, T3FREE, THYROIDAB in the last 72 hours. Anemia Panel: No results for input(s): VITAMINB12, FOLATE, FERRITIN, TIBC, IRON, RETICCTPCT in the last 72 hours. Sepsis Labs: Recent Labs  Lab 09/10/17 1707 09/11/17 0509  PROCALCITON 0.18 0.20    Recent Results (from the past 240 hour(s))  Culture, blood (routine x 2)     Status: None (Preliminary result)   Collection Time: 09/11/17  8:38 AM  Result Value Ref Range Status   Specimen Description   Final    BLOOD LEFT ANTECUBITAL Performed at [facility], [place] [address], [place], [place] [address]    Special Requests   Final    BOTTLES DRAWN AEROBIC AND ANAEROBIC  Blood Culture results may not be optimal due to an excessive volume of blood received in culture bottles Performed at [facility] [address], [place], [place] [address]    Culture PENDING  Incomplete   Report Status PENDING  Incomplete         Radiology Studies: Ct Angio Chest Pe W Or Wo Contrast  Result Date: 09/10/2017 CLINICAL DATA:  Hemoptysis.  Multiple pulmonary tumors. EXAM: CT ANGIOGRAPHY CHEST WITH CONTRAST TECHNIQUE: Multidetector CT imaging of the chest was performed using the standard protocol during bolus administration of intravenous contrast. Multiplanar CT image reconstructions and MIPs were obtained to evaluate the vascular anatomy. CONTRAST:  160mL ISOVUE-370 IOPAMIDOL (ISOVUE-370) INJECTION 76% COMPARISON:  CT-guided biopsy 09/07/2017 Chest CTA 09/05/2017 FINDINGS: Cardiovascular: --Pulmonary arteries: Contrast injection is sufficient to demonstrate satisfactory opacification of the pulmonary arteries to the segmental level. There is no pulmonary  embolus. The main pulmonary artery is within normal limits for size. --Aorta: Satisfactory opacification of the thoracic aorta. No aortic dissection or other acute aortic syndrome. Conventional 3 vessel aortic branching pattern. The aortic course and caliber are normal. There is mild aortic atherosclerosis. --Heart: Normal size. No pericardial effusion. Mediastinum/Nodes: Level 4R nodes measure up to 12 mm. Level 6 nodes measure up to 10 mm. Visualized thyroid is normal. Normal course of the esophagus. No axillary lymphadenopathy. Lungs/Pleura: The dominant mass within the left upper lobe measures 10.7 x 8.4 cm, unchanged in size. The mass encases multiple pulmonary arterial branches. There are numerous other pulmonary nodules scattered throughout both lungs. The largest nodule in the right measures 2.9 cm. Small left pleural effusion. Multiple areas of reticular opacity within both lungs has worsened, most notably in the right upper and middle lobes and the left upper lobe. The lingual bronchus is severely narrowed by the dominant mass, but unchanged from the prior study. Upper Abdomen: Contrast bolus timing is not optimized for evaluation of the abdominal organs. Within this limitation, the visualized organs of the upper abdomen are normal. Musculoskeletal: No chest wall abnormality. No acute or significant osseous findings. Review of the MIP images confirms the above findings. IMPRESSION: 1. No pulmonary embolus. 2. Unchanged size of dominant left upper lobe mass measuring up to 10.7 cm, narrowing the lingular bronchus and encasing multiple small pulmonary artery branches. This is a probable source of hemoptysis. 3. Numerous bilateral pulmonary metastatic nodules, which are also potential sources of hemoptysis. 4.  Aortic Atherosclerosis (ICD10-I70.0). Electronically Signed   By: Ulyses Jarred M.D.   On: 09/10/2017 17:04   Dg Chest Port 1 View  Result Date: 09/10/2017 CLINICAL DATA:  Continued small amount of  hemoptysis. Status post left lung mass biopsy on 09/07/2017. EXAM: PORTABLE CHEST 1 VIEW COMPARISON:  09/05/2017, chest CTA dated 09/05/2017 and CT guided left lung biopsy dated 09/07/2017. FINDINGS: The previously demonstrated large left lung mass appears larger and more confluent than on 09/05/2017. Multiple smaller masses are again demonstrated elsewhere in both lungs. Interval patchy densities in both lungs. This appears predominantly interstitial with some airspace component. No pneumothorax. Mild scoliosis. Diffuse osteopenia. Normal sized heart. IMPRESSION: 1. Interval increase in size and density of the previously demonstrated large left lung mass. 2. Multiple smaller bilateral lung masses compatible with metastases are again demonstrated. 3. Interval patchy opacities in both lungs. Differential considerations include pneumonia, pneumonitis, pulmonary edema, lymphangitic spread of tumor and hemorrhage. Electronically Signed   By: Claudie Revering M.D.   On: 09/10/2017 09:15  Scheduled Meds: . ferrous sulfate  325 mg Oral Q breakfast  . lidocaine  1 patch Transdermal Q24H   Continuous Infusions: . ceFEPime (MAXIPIME) IV Stopped (09/11/17 1125)     LOS: 6 days    Time spent: [place], MD [facility]  If 7PM-7AM, please contact night-coverage [email or web address] Password [facility] 09/11/2017, 2:49 PM

## 2017-09-12 ENCOUNTER — Encounter (HOSPITAL_COMMUNITY)
Admission: EM | Disposition: A | Discharge status: Hospice | Payer: Self-pay | Source: Home / Self Care | Attending: Internal Medicine

## 2017-09-12 ENCOUNTER — Inpatient Hospital Stay (HOSPITAL_COMMUNITY): Payer: Medicare Other

## 2017-09-12 DIAGNOSIS — R748 Abnormal levels of other serum enzymes: Secondary | ICD-10-CM

## 2017-09-12 DIAGNOSIS — D509 Iron deficiency anemia, unspecified: Secondary | ICD-10-CM

## 2017-09-12 DIAGNOSIS — R042 Hemoptysis: Secondary | ICD-10-CM

## 2017-09-12 HISTORY — PX: VIDEO BRONCHOSCOPY: SHX5072

## 2017-09-12 LAB — BASIC METABOLIC PANEL WITH GFR
BUN: 16 mg/dL (ref 8–23)
Chloride: 102 mmol/L (ref 98–111)
GFR calc Af Amer: >60 mL/min (ref >60)
Sodium: 133 mmol/L — ABNORMAL LOW (ref 135–145)

## 2017-09-12 LAB — BASIC METABOLIC PANEL
Anion gap: 7 (ref 5–15)
CO2: 24 mmol/L (ref 22–32)
Calcium: 8.4 mg/dL — ABNORMAL LOW (ref 8.9–10.3)
Creatinine, Ser: 0.99 mg/dL (ref 0.61–1.24)
GFR calc non Af Amer: >60 mL/min (ref >60)
Glucose, Bld: 84 mg/dL (ref 70–99)
Potassium: 4.8 mmol/L (ref 3.5–5.1)

## 2017-09-12 LAB — CBC
HCT: 26.9 % — ABNORMAL LOW (ref 39.0–52.0)
Hemoglobin: 8.5 g/dL — ABNORMAL LOW (ref 13.0–17.0)
MCH: 25.3 pg — ABNORMAL LOW (ref 26.0–34.0)
MCHC: 31.6 g/dL (ref 30.0–36.0)
MCV: 80.1 fL (ref 78.0–100.0)
Platelets: 307 K/uL (ref 150–400)
RBC: 3.36 MIL/uL — ABNORMAL LOW (ref 4.22–5.81)
RDW: 15.8 % — ABNORMAL HIGH (ref 11.5–15.5)
WBC: 7.6 K/uL (ref 4.0–10.5)

## 2017-09-12 LAB — PROCALCITONIN: Procalcitonin: 0.15 ng/mL

## 2017-09-12 SURGERY — VIDEO BRONCHOSCOPY WITHOUT FLUORO
Anesthesia: Moderate Sedation | Laterality: Bilateral

## 2017-09-12 MED ORDER — FENTANYL CITRATE (PF) 100 MCG/2ML IJ SOLN
INTRAMUSCULAR | Status: AC
  Filled 2017-09-12: qty 4

## 2017-09-12 MED ORDER — SODIUM CHLORIDE 0.9 % IV SOLN
Freq: Once | INTRAVENOUS | Status: AC
  Administered 2017-09-12: 14:00:00 via INTRAVENOUS

## 2017-09-12 MED ORDER — MIDAZOLAM HCL 10 MG/2ML IJ SOLN
INTRAMUSCULAR | Status: DC | PRN
  Administered 2017-09-12 (×2): 1 mg via INTRAVENOUS
  Administered 2017-09-12: 2 mg via INTRAVENOUS
  Administered 2017-09-12: 1 mg via INTRAVENOUS

## 2017-09-12 MED ORDER — LIDOCAINE HCL URETHRAL/MUCOSAL 2 % EX GEL
CUTANEOUS | Status: DC | PRN
  Administered 2017-09-12: 1

## 2017-09-12 MED ORDER — PHENYLEPHRINE HCL 0.25 % NA SOLN
NASAL | Status: DC | PRN
  Administered 2017-09-12: 2 via NASAL

## 2017-09-12 MED ORDER — LIDOCAINE HCL 1 % IJ SOLN
INTRAMUSCULAR | Status: DC | PRN
  Administered 2017-09-12: 6 mL via RESPIRATORY_TRACT

## 2017-09-12 MED ORDER — FENTANYL CITRATE (PF) 100 MCG/2ML IJ SOLN
INTRAMUSCULAR | Status: DC | PRN
  Administered 2017-09-12: 25 ug via INTRAVENOUS
  Administered 2017-09-12: 50 ug via INTRAVENOUS
  Administered 2017-09-12 (×2): 25 ug via INTRAVENOUS

## 2017-09-12 MED ORDER — MIDAZOLAM HCL 5 MG/ML IJ SOLN
INTRAMUSCULAR | Status: AC
  Filled 2017-09-12: qty 2

## 2017-09-12 NOTE — Progress Notes (Signed)
[facility] PCCM  Bronchoscopy showed bleeding from the anterior segment of the LUL.  If he develops massive hemoptysis could target this area for IR embolization, but best plan in the meantime is to await brushings from today and plan XRT if appropriate.  Roselie Awkward, MD [place] P[facility]M Pager: [phone] Cell: [phone] After 3pm or if no response, call [phone]

## 2017-09-12 NOTE — Progress Notes (Signed)
[facility] PCCM  Seen and examined today.  He has ongoing hemoptysis and no tissue diagnosis as the needle aspiration showed mostly necrotic cells.   Roselie Awkward, MD [place] P[facility]M Pager: [phone] Cell: [phone] After 3pm or if no response, call [phone]

## 2017-09-12 NOTE — H&P (Signed)
[facility] PCCM  HPI: 77 y/o male presented with dyspnea, cough, some blood tinged mucus.  Found to have multiple masses in lung, predominant left lung.  Had a needle biopsy, but tissue was necrotic.    Past Medical History:  Diagnosis Date  . Alcohol abuse   . Hearing loss   . Tobacco abuse      Family History  Problem Relation Age of Onset  . Tuberculosis Father      Social History   Socioeconomic History  . Marital status: Single    Spouse name: Not on file  . Number of children: 2  . Years of education: 30  . Highest education level: Not on file  Occupational History  . Occupation: Retired  Scientific laboratory technician  . Financial resource strain: Not on file  . Food insecurity:    Worry: Not on file    Inability: Not on file  . Transportation needs:    Medical: Not on file    Non-medical: Not on file  Tobacco Use  . Smoking status: Current Some Day Smoker    Packs/day: 0.15    Years: 58.00    Pack years: 8.70    Types: Cigarettes  . Smokeless tobacco: Never Used  Substance and Sexual Activity  . Alcohol use: Yes    Alcohol/week: 1.2 oz    Types: 2 Cans of beer per week  . Drug use: No  . Sexual activity: Not on file  Lifestyle  . Physical activity:    Days per week: Not on file    Minutes per session: Not on file  . Stress: Not on file  Relationships  . Social connections:    Talks on phone: Not on file    Gets together: Not on file    Attends religious service: Not on file    Active member of club or organization: Not on file    Attends meetings of clubs or organizations: Not on file    Relationship status: Not on file  . Intimate partner violence:    Fear of current or ex partner: Not on file    Emotionally abused: Not on file    Physically abused: Not on file    Forced sexual activity: Not on file  Other Topics Concern  . Not on file  Social History Narrative   Fun: Walks regularly, likes sports, fishing     No Known Allergies   @[email or web address] @  Vitals:   09/11/17 1559 09/11/17 2201 09/12/17 0522 09/12/17 1357  BP:  104/75 101/69 90/62  Pulse:  84 90 95  Resp:  19  17  Temp: 98.6 F (37 C) 99.1 F (37.3 C) 99.3 F (37.4 C) 99.3 F (37.4 C)  TempSrc: Oral Oral Oral Oral  SpO2:  91% 90% 97%  Weight:   127 lb 3.3 oz (57.7 kg) 127 lb (57.6 kg)  Height:    5\' 8"  (1.727 m)   RA  General:  Resting comfortably in bed HENT: NCAT OP clear PULM: CTA B, normal effort CV: RRR, no mgr GI: BS+, soft, nontender MSK: normal bulk and tone Neuro: awake, alert, no distress, MAEW   CBC    Component Value Date/Time   WBC 7.6 09/12/2017 0520   RBC 3.36 (L) 09/12/2017 0520   HGB 8.5 (L) 09/12/2017 0520   HCT 26.9 (L) 09/12/2017 0520   PLT 307 09/12/2017 0520   MCV 80.1 09/12/2017 0520   MCH 25.3 (L) 09/12/2017 0520   MCHC 31.6 09/12/2017 0520  RDW 15.8 (H) 09/12/2017 0520   LYMPHSABS 1.2 09/05/2017 1429   MONOABS 0.5 09/05/2017 1429   EOSABS 0.1 09/05/2017 1429   BASOSABS 0.0 09/05/2017 1429    BMET    Component Value Date/Time   NA 133 (L) 09/12/2017 0520   K 4.8 09/12/2017 0520   CL 102 09/12/2017 0520   CO2 24 09/12/2017 0520   GLUCOSE 84 09/12/2017 0520   BUN 16 09/12/2017 0520   CREATININE 0.99 09/12/2017 0520   CREATININE 0.98 04/19/2011 1546   CALCIUM 8.4 (L) 09/12/2017 0520   GFRNONAA >60 09/12/2017 0520   GFRAA >60 09/12/2017 0520    Impression/plan:  Hemoptysis with large left upper lobe mass and multiple bilateral metastatic lesions: plan bronchoscopy today for airway inspection, localization of bleeding and likely biopsy again.  Roselie Awkward, MD [place] P[facility]M Pager: [phone] Cell: [phone] After 3pm or if no response, call [phone]

## 2017-09-12 NOTE — Progress Notes (Signed)
PROGRESS NOTE    Corey Dorsey  ZYS:[id number] DOB: [birth year] DOA: 09/05/2017 PCP: [facility], FNP    Brief Narrative:  77 year old with no medical history admitted after experiencing anterior chest pain and pleuritic chest pain and found to have elevated troponin lung mass with multiple metastases who is status post biopsy on 09/07/2017.      Assessment & Plan:   Principal Problem:   Lung mass Active Problems:   Tobacco use disorder   Shortness of breath   Elevated troponin   Microcytic hypochromic anemia  1 fever secondary to pneumonia Patient likely with postobstructive pneumonia as CT chest with lung mass and pulmonary nodules concerning for mets.  Patient had repeat CT angiogram done 09/10/2017 showing worsening opacities.  Patient noted to have a temperature up to 101 09/10/2017.  Patient placed on IV cefepime which we will continue for now.  Blood cultures pending.  Will place on scheduled nebs.  Follow.  2.  Hemoptysis Patient noted to have small volumes of hemoptysis.  Status post transfusion of 1 unit packed red blood cells 09/10/2017.  H&H currently stable with hemoglobin currently at 8.5.  It is noted that patient's hemoptysis has been occurring for several weeks prior to biopsy which was done.  Patient status post CT-guided core biopsy of left lung mass showing mostly necrotic tissue per pulmonary.  IR was consulted and recommended discussion with pulmonary about localizing hemoptysis prior to consideration of called embolization if patient were to have significant worsening hemoptysis.  Patient seen again by pulmonary on 09/12/2017 and patient subsequently underwent bronchoscopy with brushings taken.  Per pulmonary bronchoscopy showed bleeding from anterior segment of the left upper lobe.  Per pulmonary patient develops massive hemoptysis could target this area for IR embolization.  Brushings pending.  Per pulmonary.  3.  Lung mass status post biopsy 09/07/2017.   Concerning for bronchogenic carcinoma with diffuse mets.  Patient with ongoing hemoptysis.  Pending.  Patient underwent bronchoscopy 09/12/2017 with brushings taken and results pending.  Pulmonary following.  Once pathology has been obtained will need follow-up with oncology.  4.  Elevated troponin 2D echo with normal EF and grade 1 diastolic dysfunction.  CT chest did show significant calcific disease.  Patient seen in consultation by cardiology who feel patient is not a very good candidate for intervention at this time particularly with his hemoptysis and possibly limited life expectancy.  Follow.  5.  Iron deficiency anemia/anemia of chronic disease/acute blood loss anemia Status post 1 unit packed red blood cells 09/10/2017.  Hemoglobin currently stable at 8.5.  Continue oral iron supplementation.  Follow.   DVT prophylaxis: SCDs Code Status: Full Family Communication updated patient.  No family at bedside. Disposition Plan: Pending work-up   Consultants:   PCCM Dr. Lynetta Mare 09/06/2017  Cardiology Dr. Harrington Challenger 09/06/2017  Interventional radiology  Procedures:   CT angiogram chest 09/10/2017, 09/05/2017  CT left shoulder 09/07/2017  CT-guided core biopsy left lung mass per IR Dr. Kathlene Cote 09/07/2017  Chest x-ray 09/05/2017, 09/10/2017  2D echo 09/06/2017  Bronchoscopy per Dr. Lake Bells 09/12/2017  Transfuse 1 unit packed red blood cells 09/10/2017.  Antimicrobials:   IV cefepime 09/11/2017     Subjective: Patient denies any chest pain.  States some improvement with shortness of breath.  Still with hemoptysis which he states is not as dark as on admission.  Objective: Vitals:   09/12/17 1435 09/12/17 1440 09/12/17 1455 09/12/17 1510  BP: 115/64 109/75 93/62 (!) 88/54  Pulse:  Resp: (!) 27 (!) 24 20 (!) 25  Temp:      TempSrc:      SpO2: 91% 91% 92% 92%  Weight:      Height:        Intake/Output Summary (Last 24 hours) at 09/12/2017 2004 Last data filed at 09/12/2017  1900 Gross per 24 hour  Intake 268.67 ml  Output 0 ml  Net 268.67 ml   Filed Weights   09/11/17 0515 09/12/17 0522 09/12/17 1357  Weight: 56.1 kg (123 lb 10.9 oz) 57.7 kg (127 lb 3.3 oz) 57.6 kg (127 lb)    Examination:  General exam: Appears calm and comfortable  Respiratory system: Clear to auscultation. Respiratory effort normal. Cardiovascular system: S1 & S2 heard, RRR. No JVD, murmurs, rubs, gallops or clicks. No pedal edema. Gastrointestinal system: Abdomen is nondistended, soft and nontender. No organomegaly or masses felt. Normal bowel sounds heard. Central nervous system: Alert and oriented. No focal neurological deficits. Extremities: Symmetric 5 x 5 power. Skin: No rashes, lesions or ulcers Psychiatry: Judgement and insight appear normal. Mood & affect appropriate.     Data Reviewed: I have personally reviewed following labs and imaging studies  CBC: Recent Labs  Lab 09/06/17 0459 09/10/17 0541 09/10/17 1707 09/11/17 0509 09/12/17 0520  WBC 6.2 7.2  --  8.4 7.6  HGB 8.2* 7.1* 8.4* 8.2* 8.5*  HCT 26.4* 22.5* 27.0* 26.3* 26.9*  MCV 79.5 78.9  --  79.2 80.1  PLT 267 276  --  293 160   Basic Metabolic Panel: Recent Labs  Lab 09/06/17 0459 09/07/17 0526 09/10/17 0541 09/12/17 0520  NA 136 133* 134* 133*  K 4.3 4.5 4.4 4.8  CL 104 102 101 102  CO2 26 23 27 24   GLUCOSE 89 76 86 84  BUN 12 15 17 16   CREATININE 1.03 0.99 1.05 0.99  CALCIUM 8.4* 8.5* 8.5* 8.4*  MG  --   --  2.3  --    GFR: Estimated Creatinine Clearance: 50.1 mL/min (by C-G formula based on SCr of 0.99 mg/dL). Liver Function Tests: No results for input(s): AST, ALT, ALKPHOS, BILITOT, PROT, ALBUMIN in the last 168 hours. No results for input(s): LIPASE, AMYLASE in the last 168 hours. No results for input(s): AMMONIA in the last 168 hours. Coagulation Profile: No results for input(s): INR, PROTIME in the last 168 hours. Cardiac Enzymes: Recent Labs  Lab 09/05/17 2312 09/06/17 0459  09/06/17 1117  TROPONINI 1.13* 1.04* 0.97*   BNP (last 3 results) No results for input(s): PROBNP in the last 8760 hours. HbA1C: No results for input(s): HGBA1C in the last 72 hours. CBG: No results for input(s): GLUCAP in the last 168 hours. Lipid Profile: No results for input(s): CHOL, HDL, LDLCALC, TRIG, CHOLHDL, LDLDIRECT in the last 72 hours. Thyroid Function Tests: No results for input(s): TSH, T4TOTAL, FREET4, T3FREE, THYROIDAB in the last 72 hours. Anemia Panel: No results for input(s): VITAMINB12, FOLATE, FERRITIN, TIBC, IRON, RETICCTPCT in the last 72 hours. Sepsis Labs: Recent Labs  Lab 09/10/17 1707 09/11/17 0509 09/12/17 0520  PROCALCITON 0.18 0.20 0.15    Recent Results (from the past 240 hour(s))  Culture, blood (routine x 2)     Status: None (Preliminary result)   Collection Time: 09/11/17  8:38 AM  Result Value Ref Range Status   Specimen Description   Final    BLOOD LEFT ANTECUBITAL Performed at [facility], [place] [address] [place], [place] [address]    Special Requests  Final    BOTTLES DRAWN AEROBIC AND ANAEROBIC Blood Culture results may not be optimal due to an excessive volume of blood received in culture bottles Performed at [place] [facility] [address], [place], [place] [address]    Culture   Final    NO GROWTH 1 DAY Performed at [facility], [place] [address], [place], [place] [address]    Report Status PENDING  Incomplete  Culture, blood (routine x 2)     Status: None (Preliminary result)   Collection Time: 09/11/17  8:48 AM  Result Value Ref Range Status   Specimen Description   Final    BLOOD RIGHT HAND Performed at [facility] [address], [place], [place] [address]    Special Requests   Final    BOTTLES DRAWN AEROBIC AND ANAEROBIC Blood Culture adequate volume Performed at [facility] [address], [place], [place] [address]    Culture   Final    NO GROWTH  1 DAY Performed at [facility], [place] [address] [place], [place] [address]    Report Status PENDING  Incomplete         Radiology Studies: No results found.      Scheduled Meds: . ferrous sulfate  325 mg Oral Q breakfast  . lidocaine  1 patch Transdermal Q24H   Continuous Infusions: . ceFEPime (MAXIPIME) IV Stopped (09/12/17 1001)     LOS: 7 days    Time spent: 35 minutes    Irine Seal, MD [facility] Pager [phone]  If 7PM-7AM, please contact night-coverage [email or web address] Password [facility] 09/12/2017, 8:04 PM

## 2017-09-12 NOTE — Progress Notes (Signed)
Name: Corey Dorsey MRN: [id number] DOB: August 28, 1940    ADMISSION DATE:  09/05/2017 CONSULTATION DATE:  7/17   REFERRING MD :  Allyson Sabal  CHIEF COMPLAINT:  Abnormal CT chest  BRIEF PATIENT DESCRIPTION:  77 year old male smoker with limited medical history due to minimal visits to healthcare providers.  Presents to the hospital on 7/16 with few week history of pleuritic type chest discomfort.  Noted the chest pain to be primarily the anterior chest wall, associated primarily with deep inspiration but also more recently present with rest.  He said intermittent blood-tinged sputum production, weight loss over the last few weeks and decreased appetite.  In the emergency room he was afebrile,Minute his white blood cell count was normal, his pulse oximetry on room air was 87%.  As part of his ER evaluation a CT of chest was obtained, this demonstrated numerous bilateral lung nodules with a dominant 10 cm lingular mass on the left side, also mildly enlarged hilar lymphadenopathy.  All recent concern for cancer.  Pulmonary has been asked to see in evaluation for concern about abnormal CT findings and to help with diagnostics.  SIGNIFICANT EVENTS    STUDIES:  CT chest obtained 7/16: Negative for pulmonary emboli.  Numerous bilateral lung nodules consistent with metastatic disease.  There is a large dominant 10 cm lingular mass on the left.  There is diffuse hilar adenopathy.   SUBJECTIVE/INTERVAL:  S/p lung mass bx on 7/18 via CT 7/19 coughing up bloody sputum over night.  7/21 increased size and density of left lung mass. Still coughing up blood. CT chest repeated: LUL lung mass up to 10.7 cm w/ narrowing of lingular bronchus 7/23 continued hemoptysis. Required transfusion. Seen By IR. They are requesting that we bronch to help isolate location of bleeding.  Volume of hemoptysis around 1/4 cup a day of old dark blood  VITAL SIGNS: Temp:  [98.6 F (37 C)-100.2 F (37.9 C)] 99.3 F (37.4 C)  (07/23 0522) Pulse Rate:  [84-97] 90 (07/23 0522) Resp:  [16-19] 19 (07/22 2201) BP: (101-104)/(66-75) 101/69 (07/23 0522) SpO2:  [90 %-91 %] 90 % (07/23 0522) Weight:  [127 lb 3.3 oz (57.7 kg)] 127 lb 3.3 oz (57.7 kg) (07/23 0522)  PHYSICAL EXAMINATION:  General: This is a frail 77 year old African-American male he is currently resting in bed he is in no acute distress HEENT normocephalic atraumatic temporal wasting mucous membranes moist Pulmonary: Diminished throughout no accessory use left anterior dressing on chest is clean dry and intact Cardiac: Regular rate and rhythm Abdomen: Soft nontender Extremities: Brisk cap refill no edema  Recent Labs  Lab 09/07/17 0526 09/10/17 0541 09/12/17 0520  NA 133* 134* 133*  K 4.5 4.4 4.8  CL 102 101 102  CO2 23 27 24   BUN 15 17 16   CREATININE 0.99 1.05 0.99  GLUCOSE 76 86 84   Recent Labs  Lab 09/10/17 0541 09/10/17 1707 09/11/17 0509 09/12/17 0520  HGB 7.1* 8.4* 8.2* 8.5*  HCT 22.5* 27.0* 26.3* 26.9*  WBC 7.2  --  8.4 7.6  PLT 276  --  293 307   Ct Angio Chest Pe W Or Wo Contrast  Result Date: 09/10/2017 CLINICAL DATA:  Hemoptysis.  Multiple pulmonary tumors. EXAM: CT ANGIOGRAPHY CHEST WITH CONTRAST TECHNIQUE: Multidetector CT imaging of the chest was performed using the standard protocol during bolus administration of intravenous contrast. Multiplanar CT image reconstructions and MIPs were obtained to evaluate the vascular anatomy. CONTRAST:  170mL ISOVUE-370 IOPAMIDOL (ISOVUE-370) INJECTION 76% COMPARISON:  CT-guided biopsy 09/07/2017 Chest CTA 09/05/2017 FINDINGS: Cardiovascular: --Pulmonary arteries: Contrast injection is sufficient to demonstrate satisfactory opacification of the pulmonary arteries to the segmental level. There is no pulmonary embolus. The main pulmonary artery is within normal limits for size. --Aorta: Satisfactory opacification of the thoracic aorta. No aortic dissection or other acute aortic syndrome.  Conventional 3 vessel aortic branching pattern. The aortic course and caliber are normal. There is mild aortic atherosclerosis. --Heart: Normal size. No pericardial effusion. Mediastinum/Nodes: Level 4R nodes measure up to 12 mm. Level 6 nodes measure up to 10 mm. Visualized thyroid is normal. Normal course of the esophagus. No axillary lymphadenopathy. Lungs/Pleura: The dominant mass within the left upper lobe measures 10.7 x 8.4 cm, unchanged in size. The mass encases multiple pulmonary arterial branches. There are numerous other pulmonary nodules scattered throughout both lungs. The largest nodule in the right measures 2.9 cm. Small left pleural effusion. Multiple areas of reticular opacity within both lungs has worsened, most notably in the right upper and middle lobes and the left upper lobe. The lingual bronchus is severely narrowed by the dominant mass, but unchanged from the prior study. Upper Abdomen: Contrast bolus timing is not optimized for evaluation of the abdominal organs. Within this limitation, the visualized organs of the upper abdomen are normal. Musculoskeletal: No chest wall abnormality. No acute or significant osseous findings. Review of the MIP images confirms the above findings. IMPRESSION: 1. No pulmonary embolus. 2. Unchanged size of dominant left upper lobe mass measuring up to 10.7 cm, narrowing the lingular bronchus and encasing multiple small pulmonary artery branches. This is a probable source of hemoptysis. 3. Numerous bilateral pulmonary metastatic nodules, which are also potential sources of hemoptysis. 4.  Aortic Atherosclerosis (ICD10-I70.0). Electronically Signed   By: Ulyses Jarred M.D.   On: 09/10/2017 17:04    ASSESSMENT / PLAN:   Numerous pulmonary nodules with dominant left lingular lung mass measuring approximately 10 cm in size On-going Hemoptysis  Post obstructive PNA   Primary Pathology still pending. Prelim showing: most of cores were necrotic. Definitely  cancer but need more tissue for formal dx  Has had on-going hemoptysis (not excessive volumes but still present) Fever improved since starting abx.   Plan/rec NPO  We have him set up for bronchoscopy at 2:00 today for airway inspection and repeat bx  F/u surgical path  Cont abx day #2   Erick Colace ACNP-BC [place] Pager # [phone] OR # [phone] if no answer     09/12/2017, 11:15 AM

## 2017-09-12 NOTE — Progress Notes (Addendum)
2:23 PM Provided patient's daughter with bed offers.   LCSW provided patient with bed offers. Patient request that LCSW speak with his daughter, [name]. LCSW left message for [name] to return call.   LCSW will continue to follow for dc needs.   Carolin Coy [place]

## 2017-09-12 NOTE — Op Note (Signed)
[facility] Cardiopulmonary Patient Name: Corey Dorsey Procedure Date: 09/12/2017 MRN: [id number] Attending MD: Juanito Doom , MD Date of Birth: 1940-12-29 CSN: [id number] Age: 77 Admit Type: Inpatient Ethnicity: Not Hispanic or Latino Procedure:            Bronchoscopy Indications:          Left upper lobe mass, Hemoptysis with abnormal CXR Providers:            Nathaneil Canary B. Randa Riss, MD, Alice "Alex" Sparta RRT, RCP,                        Phillis Knack RRT, RCP Referring MD:          Medicines:            Fentanyl 125 mcg IV, Midazolam 5 mg IV, Lidocaine                        applied to nares and subglottic space Complications:        No immediate complications Estimated Blood Loss: Estimated blood loss was minimal. Procedure:      Pre-Anesthesia Assessment:      - A History and Physical has been performed. Patient meds and allergies       have been reviewed. The risks and benefits of the procedure and the       sedation options and risks were discussed with the patient. All       questions were answered and informed consent was obtained. Patient       identification and proposed procedure were verified prior to the       procedure by the physician and the technician in the procedure room.       Mental Status Examination: alert and oriented. Airway Examination:       normal oropharyngeal airway. Respiratory Examination: clear to       auscultation. CV Examination: normal. ASA Grade Assessment: II - A       patient with mild systemic disease. After reviewing the risks and       benefits, the patient was deemed in satisfactory condition to undergo       the procedure. The anesthesia plan was to use moderate sedation /       analgesia (conscious sedation). Immediately prior to administration of       medications, the patient was re-assessed for adequacy to receive       sedatives. The heart rate, respiratory rate, oxygen saturations, blood       pressure,  adequacy of pulmonary ventilation, and response to care were       monitored throughout the procedure. The physical status of the patient       was re-assessed after the procedure.      After obtaining informed consent, the bronchoscope was passed under       direct vision. Throughout the procedure, the patient's blood pressure,       pulse, and oxygen saturations were monitored continuously. the [id number]       ([id number]) Olympus Therapeutic Bronchoscope was introduced through the       mouth and advanced to the tracheobronchial tree. The procedure was       accomplished without difficulty. The patient tolerated the procedure       fairly well. The total duration of the procedure was 15 minutes. The       procedure was accomplished without difficulty. The  patient tolerated the       procedure fairly well. Findings:      The nasopharynx/oropharynx appears normal. The larynx appears normal.       The vocal cords appear normal. The subglottic space is normal. The       trachea is of normal caliber. The carina is sharp. The tracheobronchial       tree of the right lung was examined to at least the first subsegmental       level. Bronchial mucosa and anatomy in the right lung are normal; there       are no endobronchial lesions, and no secretions.      Left Lung Abnormalities: Fresh blood was found in the anterior segment       of the left upper lobe (B3). The bleeding site was identified. A blot       clot was found in the anterior segment of the left upper lobe (B3). It       was partially obstructing the airway. Guided brushings were obtained in       the anterior segment of the left upper lobe with a cytology brush and       sent for routine cytology. Two samples were obtained. BAL was performed       in the LUL anterior segment (B3) of the lung and sent for routine       cytology. 60 mL of fluid were instilled. 25 mL were returned. The return       was bloody. There were no mucoid plugs in  the return fluid. There were       no underlying airway lesions or masses identified. There appeared to be       extrinsic compression of a subsegment of the left upper lobe but there       was no endobronchial mass seen. I performed brushings from the left       upper lobe anterior segment because this is where the bleeding seemed to       be coming from. I wanted to perform endobronchial biopsies from there as       well but the patient had significant cough and intolerance of the       procedure at this point despite escalating doses of sedation. Impression:      - Left upper lobe mass      - Hemoptysis with abnormal CXR      - The airway examination of the right lung was normal.      - Blood was present in the anterior segment of the left upper lobe (B3).      - A blood clot was found in the anterior segment of the left upper lobe       (B3).      - Brushings were obtained.      - Bronchoalveolar lavage was performed. Moderate Sedation:      Moderate (conscious) sedation was personally administered by the       pulmonologist. The following parameters were monitored: oxygen       saturation, heart rate, blood pressure, and response to care. Total       physician intraservice time was 22 minutes. Recommendation:      - Await BAL, brushing and cytology results. Procedure Code(s):      --- Professional ---      [phone], Bronchoscopy, rigid or flexible, including fluoroscopic guidance,       when performed; with bronchial alveolar lavage  31623, Bronchoscopy, rigid or flexible, including fluoroscopic guidance,       when performed; with brushing or protected brushings      99152, Moderate sedation services provided by the same physician or       other qualified health care professional performing the diagnostic or       therapeutic service that the sedation supports, requiring the presence       of an independent trained observer to assist in the monitoring of the       patient's  level of consciousness and physiological status; initial 15       minutes of intraservice time, patient age 93 years or older Diagnosis Code(s):      --- Professional ---      R91.8, Other nonspecific abnormal finding of lung field      R04.2, Hemoptysis CPT copyright 2017 [employer] Association. All rights reserved. The codes documented in this report are preliminary and upon coder review may  be revised to meet current compliance requirements. Norlene Campbell, MD Juanito Doom, MD 09/12/2017 2:48:36 PM This report has been signed electronically. Number of Addenda: 0 Scope In: 2:17:51 PM Scope Out: 2:32:29 PM

## 2017-09-12 NOTE — Progress Notes (Signed)
NT informed this RN of patient's O2 saturation reading 79% on oxygen. Upon entering the room, the patient respirations, equal, nonlabored, did not have nasal cannula in correctly, that was readjusted. Instructed patient to take deep breaths in through nose, 02 came up to upper 80s, increased o2 flow to 5L [place]. Patient satting 92%. RT Amy called to discuss care and for additional support. On call [place] paged and given orders for continuous pulse ox.

## 2017-09-12 NOTE — Progress Notes (Signed)
Video bronchoscopy performed.    Intervention bronchial brushing. Intervention bronchial washing.  No complications noted.  Will continue to monitor.

## 2017-09-13 ENCOUNTER — Inpatient Hospital Stay (HOSPITAL_COMMUNITY): Payer: Medicare Other

## 2017-09-13 ENCOUNTER — Encounter (HOSPITAL_COMMUNITY): Payer: Self-pay | Admitting: Pulmonary Disease

## 2017-09-13 ENCOUNTER — Other Ambulatory Visit: Payer: Self-pay | Admitting: Pulmonary Disease

## 2017-09-13 DIAGNOSIS — R0602 Shortness of breath: Secondary | ICD-10-CM

## 2017-09-13 DIAGNOSIS — F17210 Nicotine dependence, cigarettes, uncomplicated: Secondary | ICD-10-CM

## 2017-09-13 DIAGNOSIS — C3492 Malignant neoplasm of unspecified part of left bronchus or lung: Secondary | ICD-10-CM

## 2017-09-13 DIAGNOSIS — C3491 Malignant neoplasm of unspecified part of right bronchus or lung: Secondary | ICD-10-CM

## 2017-09-13 DIAGNOSIS — R0902 Hypoxemia: Secondary | ICD-10-CM

## 2017-09-13 LAB — BASIC METABOLIC PANEL
Anion gap: 7 (ref 5–15)
BUN: 19 mg/dL (ref 8–23)
CALCIUM: 8.2 mg/dL — AB (ref 8.9–10.3)
CO2: 24 mmol/L (ref 22–32)
CREATININE: 1.07 mg/dL (ref 0.61–1.24)
Chloride: 103 mmol/L (ref 98–111)
GFR calc Af Amer: >60 mL/min (ref >60)
GLUCOSE: 85 mg/dL (ref 70–99)
Potassium: 4.6 mmol/L (ref 3.5–5.1)
SODIUM: 134 mmol/L — AB (ref 135–145)

## 2017-09-13 LAB — CBC
HCT: 24.4 % — ABNORMAL LOW (ref 39.0–52.0)
Hemoglobin: 7.5 g/dL — ABNORMAL LOW (ref 13.0–17.0)
MCH: 24.6 pg — AB (ref 26.0–34.0)
MCHC: 30.7 g/dL (ref 30.0–36.0)
MCV: 80 fL (ref 78.0–100.0)
PLATELETS: 315 10*3/uL (ref 150–400)
RBC: 3.05 MIL/uL — ABNORMAL LOW (ref 4.22–5.81)
RDW: 16.3 % — AB (ref 11.5–15.5)
WBC: 8.3 10*3/uL (ref 4.0–10.5)

## 2017-09-13 LAB — HEMOGLOBIN AND HEMATOCRIT, BLOOD
HCT: 27.3 % — ABNORMAL LOW (ref 39.0–52.0)
Hemoglobin: 8.3 g/dL — ABNORMAL LOW (ref 13.0–17.0)

## 2017-09-13 MED ORDER — IOPAMIDOL (ISOVUE-300) INJECTION 61%
INTRAVENOUS | Status: AC
  Filled 2017-09-13: qty 100

## 2017-09-13 MED ORDER — IOPAMIDOL (ISOVUE-300) INJECTION 61%
100.0000 mL | Freq: Once | INTRAVENOUS | Status: AC | PRN
  Administered 2017-09-13: 100 mL via INTRAVENOUS

## 2017-09-13 NOTE — Progress Notes (Signed)
PROGRESS NOTE    Corey Dorsey  MPN:[id number] DOB: [birth year] DOA: 09/05/2017 PCP: Golden Circle, FNP    Brief Narrative:  77 year old with no medical history admitted after experiencing anterior chest pain and pleuritic chest pain and found to have elevated troponin lung mass with multiple metastases who is status post biopsy on 09/07/2017.      Assessment & Plan:   Principal Problem:   Lung mass Active Problems:   Tobacco use disorder   Shortness of breath   Elevated troponin   Microcytic hypochromic anemia   Hemoptysis  1 fever secondary to postobstructive pneumonia Patient likely with postobstructive pneumonia as CT chest with lung mass and pulmonary nodules concerning for mets.  Patient had repeat CT angiogram done 09/10/2017 showing worsening opacities.  Patient noted to have a temperature up to 101 09/10/2017.  Patient placed on IV cefepime which we will continue for now.  Blood cultures pending.  Continue scheduled nebulizers. Follow.  2.  Hemoptysis Patient noted to have small volumes of hemoptysis.  Status post transfusion of 1 unit packed red blood cells 09/10/2017. Hemoglobin currently at 7.5.  It is noted that patient's hemoptysis has been occurring for several weeks prior to biopsy which was done.  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 IR was consulted and recommended discussion with pulmonary about localizing hemoptysis prior to consideration of called embolization if patient were to have significant worsening hemoptysis.   Patient seen again by pulmonary on 09/12/2017 and patient subsequently underwent bronchoscopy with brushings taken.  Per pulmonary bronchoscopy showed bleeding from anterior segment of the left upper lobe.  Per pulmonary if patient develops massive hemoptysis could target this area for IR embolization.  Brushings pending.  Per  pulmonary.  3.  Lung mass status post biopsy 09/07/2017.  Concerning for bronchogenic carcinoma with diffuse mets.  Patient with ongoing hemoptysis.  Patient underwent bronchoscopy 09/12/2017 with brushings taken and results pending.  Pulmonary following.  Biopsies done by interventional radiology with pathology with necrosis with focal poorly differentiated carcinoma.  Patient with metastatic disease.  We will check a CT head.  Consult with oncology for further evaluation and management.  Palliative care consultation also pending.  4.  Hypoxia Was called by RN as patient noted to be hypoxic with sats in the 70s to 80s despite 6 L nasal cannula.  Patient noted to be hypoxic overnight with sats of 79% and patient noted to not have nasal cannula incorrectly.  Nasal cannula placed and patient noted to have sats in the 80s and O2 had to be increased to 5 L with sats of 92% overnight.  On evaluation of patient patient in no significant respiratory distress.  Patient noted to have some clubbing on his fingers.  Pulse ox moved to patient's here with sats going immediately up to 100% on 6 L nasal cannula.  O2 weaned down to liters with sats of 100%.  Continue current treatment for probable postobstructive pneumonia and scheduled nebs.  5.  Elevated troponin 2D echo with normal EF and grade 1 diastolic dysfunction.  CT chest did show significant calcific disease.  Patient seen in consultation by cardiology who feel patient is not a very good candidate for intervention at this time particularly with his hemoptysis and possibly limited life expectancy.  Follow.  6.  Iron deficiency anemia/anemia of chronic disease/acute blood loss anemia Status post 1 unit packed red blood cells  09/10/2017.  Hemoglobin currently stable at 7. 5.  Repeat H&H this afternoon.  Continue oral iron supplementation.  Give a dose of IV Feraheme tomorrow.  Follow.   DVT prophylaxis: SCDs Code Status: Full Family Communication updated  patient.  No family at bedside. Disposition Plan: Pending work-up   Consultants:   PCCM Dr. Lynetta Mare 09/06/2017  Cardiology Dr. Harrington Challenger 09/06/2017  Interventional radiology  Palliative care pending  Oncology pending  Procedures:   CT angiogram chest 09/10/2017, 09/05/2017  CT left shoulder 09/07/2017  CT-guided core biopsy left lung mass per IR Dr. Kathlene Cote 09/07/2017  Chest x-ray 09/05/2017, 09/10/2017  2D echo 09/06/2017  Bronchoscopy per Dr. Lake Bells 09/12/2017  Transfuse 1 unit packed red blood cells 09/10/2017.  Antimicrobials:   IV cefepime 09/11/2017     Subjective: Patient with some complaints of chest pain.  Denies any significant shortness of breath.  Hemoptysis slowly improving and clearing up.  Was called by RN as patient noted to desat into the 70s and 80s requiring 6 L nasal cannula.  Patient does not look in extremis.  Daughter requesting palliative care consultation.  Objective: Vitals:   09/12/17 1510 09/12/17 2113 09/13/17 0458 09/13/17 0640  BP: (!) 88/54 110/73 99/64   Pulse:  (!) 114 88   Resp: (!) 25 16 15    Temp:  98.4 F (36.9 C) 98.2 F (36.8 C)   TempSrc:  Oral Oral   SpO2: 92% 93% 95%   Weight:    57.8 kg (127 lb 6.8 oz)  Height:        Intake/Output Summary (Last 24 hours) at 09/13/2017 1023 Last data filed at 09/13/2017 0511 Gross per 24 hour  Intake 168.67 ml  Output 375 ml  Net -206.33 ml   Filed Weights   09/12/17 0522 09/12/17 1357 09/13/17 0640  Weight: 57.7 kg (127 lb 3.3 oz) 57.6 kg (127 lb) 57.8 kg (127 lb 6.8 oz)    Examination:  General exam: Frail. Respiratory system: Some diffuse crackles.  Some coarse breath sounds.  No wheezing noted.  Speaking in full sentences.  Respiratory effort normal. Cardiovascular system: Regular rate and rhythm no murmurs rubs or gallops.  No JVD.  No lower extremity edema.  Gastrointestinal system: Abdomen is soft, nontender, nondistended, positive bowel sounds.  No rebound.  No guarding.    Central nervous system: Alert and oriented. No focal neurological deficits. Extremities: Symmetric 5 x 5 power. Skin: No rashes, lesions or ulcers Psychiatry: Judgement and insight appear poor to fair. Mood & affect appropriate.     Data Reviewed: I have personally reviewed following labs and imaging studies  CBC: Recent Labs  Lab 09/10/17 0541 09/10/17 1707 09/11/17 0509 09/12/17 0520 09/13/17 0528  WBC 7.2  --  8.4 7.6 8.3  HGB 7.1* 8.4* 8.2* 8.5* 7.5*  HCT 22.5* 27.0* 26.3* 26.9* 24.4*  MCV 78.9  --  79.2 80.1 80.0  PLT 276  --  293 307 017   Basic Metabolic Panel: Recent Labs  Lab 09/07/17 0526 09/10/17 0541 09/12/17 0520 09/13/17 0528  NA 133* 134* 133* 134*  K 4.5 4.4 4.8 4.6  CL 102 101 102 103  CO2 23 27 24 24   GLUCOSE 76 86 84 85  BUN 15 17 16 19   CREATININE 0.99 1.05 0.99 1.07  CALCIUM 8.5* 8.5* 8.4* 8.2*  MG  --  2.3  --   --    GFR: Estimated Creatinine Clearance: 46.5 mL/min (by C-G formula based on SCr of 1.07 mg/dL). Liver Function  Tests: No results for input(s): AST, ALT, ALKPHOS, BILITOT, PROT, ALBUMIN in the last 168 hours. No results for input(s): LIPASE, AMYLASE in the last 168 hours. No results for input(s): AMMONIA in the last 168 hours. Coagulation Profile: No results for input(s): INR, PROTIME in the last 168 hours. Cardiac Enzymes: Recent Labs  Lab 09/06/17 1117  TROPONINI 0.97*   BNP (last 3 results) No results for input(s): PROBNP in the last 8760 hours. HbA1C: No results for input(s): HGBA1C in the last 72 hours. CBG: No results for input(s): GLUCAP in the last 168 hours. Lipid Profile: No results for input(s): CHOL, HDL, LDLCALC, TRIG, CHOLHDL, LDLDIRECT in the last 72 hours. Thyroid Function Tests: No results for input(s): TSH, T4TOTAL, FREET4, T3FREE, THYROIDAB in the last 72 hours. Anemia Panel: No results for input(s): VITAMINB12, FOLATE, FERRITIN, TIBC, IRON, RETICCTPCT in the last 72 hours. Sepsis Labs: Recent  Labs  Lab 09/10/17 1707 09/11/17 0509 09/12/17 0520  PROCALCITON 0.18 0.20 0.15    Recent Results (from the past 240 hour(s))  Culture, blood (routine x 2)     Status: None (Preliminary result)   Collection Time: 09/11/17  8:38 AM  Result Value Ref Range Status   Specimen Description   Final    BLOOD LEFT ANTECUBITAL Performed at [facility], [place] [address], [place], [place] [address]    Special Requests   Final    BOTTLES DRAWN AEROBIC AND ANAEROBIC Blood Culture results may not be optimal due to an excessive volume of blood received in culture bottles Performed at [facility] [address], [place], [place] [address]    Culture   Final    NO GROWTH 1 DAY Performed at [facility], [place] [address] [place], [place] [address]    Report Status PENDING  Incomplete  Culture, blood (routine x 2)     Status: None (Preliminary result)   Collection Time: 09/11/17  8:48 AM  Result Value Ref Range Status   Specimen Description   Final    BLOOD RIGHT HAND Performed at [facility] [address] [place], [place] [address]    Special Requests   Final    BOTTLES DRAWN AEROBIC AND ANAEROBIC Blood Culture adequate volume Performed at [facility] [address], [place], [place] [address]    Culture   Final    NO GROWTH 1 DAY Performed at [facility], [place] [address], [place], [place] [address]    Report Status PENDING  Incomplete         Radiology Studies: No results found.      Scheduled Meds: . ferrous sulfate  325 mg Oral Q breakfast  . lidocaine  1 patch Transdermal Q24H   Continuous Infusions: . ceFEPime (MAXIPIME) IV Stopped (09/12/17 2130)     LOS: 8 days    Time spent: 40 minutes    Irine Seal, MD [facility] Pager [phone]  If 7PM-7AM, please contact night-coverage [email or web address] Password [facility] 09/13/2017, 10:23 AM

## 2017-09-13 NOTE — Progress Notes (Signed)
PMT no charge note  Consult request received Chart reviewed Patient seen, elderly gentleman resting in bed Opens eyes, but doesn't verbalize or engage No family at bedside.   Will discuss with daughter Janace Hoard.  Await biopsy results from his recent bronchoscopy.   Full note and final recommendations to follow Agree with SNF on discharge.   Loistine Chance MD [facility] health palliative medicine team [phone]

## 2017-09-13 NOTE — Progress Notes (Signed)
Name: Corey Dorsey MRN: [id number] DOB: 12-27-1940    ADMISSION DATE:  09/05/2017 CONSULTATION DATE: 09/06/2017  CHIEF COMPLAINT: Abnormal CT chest/ hemoptysis  BRIEF PATIENT DESCRIPTION:  77 year old smoker with lung mass, multiple lung nodules status post biopsy Postobstructive pneumonia Biopsy results pending  SIGNIFICANT EVENTS  Status post bronchoscopy on 09/12/2017-significant for bleeding notable in the left upper lobe Status post biopsy-path pending  STUDIES:  CT scan of the chest reviewed by myself   HISTORY OF PRESENT ILLNESS:   Improving hemoptysis Did have some chest discomfort, shortness of breath however stable We will continue to follow-up surgical pathology  PAST MEDICAL HISTORY :   has a past medical history of Alcohol abuse, Hearing loss, and Tobacco abuse.  has a past surgical history that includes Abdominal surgery. Prior to Admission medications   Medication Sig Start Date End Date Taking? Authorizing Provider  acetaminophen (TYLENOL) 500 MG tablet Take 500 mg by mouth daily as needed for mild pain.   Yes [provider]  naproxen sodium (ALEVE) 220 MG tablet Take 220 mg by mouth daily as needed (pain).   Yes [provider]   No Known Allergies  FAMILY HISTORY:  family history includes Tuberculosis in his father. SOCIAL HISTORY:  reports that he has been smoking cigarettes.  He has a 8.70 pack-year smoking history. He has never used smokeless tobacco. He reports that he drinks about 1.2 oz of alcohol per week. He reports that he does not use drugs.  REVIEW OF SYSTEMS:   Review of Systems  Constitutional: Negative for fever.  HENT: Positive for ear pain.   Eyes: Negative.   Respiratory: Positive for cough, hemoptysis and shortness of breath.   Cardiovascular: Positive for chest pain. Negative for palpitations, orthopnea, claudication and leg swelling.  All other systems reviewed and are negative.  SUBJECTIVE:   VITAL  SIGNS: Temp:  [98.2 F (36.8 C)-99.3 F (37.4 C)] 98.2 F (36.8 C) (07/24 0458) Pulse Rate:  [88-114] 88 (07/24 0458) Resp:  [15-27] 15 (07/24 0458) BP: (83-130)/(53-88) 99/64 (07/24 0458) SpO2:  [88 %-98 %] 95 % (07/24 0458) Weight:  [127 lb (57.6 kg)-127 lb 6.8 oz (57.8 kg)] 127 lb 6.8 oz (57.8 kg) (07/24 0640)  PHYSICAL EXAMINATION: General: Alert and oriented x3 Neuro: Cranial extremities with no focal findings HEENT: No nasal lesions noted, no oral lesions noted Cardiovascular: S1-S2 appreciated, no murmurs Lungs: Rhonchi, rales at the bases worse at the left base, no tenderness Abdomen: Soft, bowel sounds appreciated Musculoskeletal: Moving all extremities Skin: No skin lesions noted  Recent Labs  Lab 09/10/17 0541 09/12/17 0520 09/13/17 0528  NA 134* 133* 134*  K 4.4 4.8 4.6  CL 101 102 103  CO2 27 24 24   BUN 17 16 19   CREATININE 1.05 0.99 1.07  GLUCOSE 86 84 85   Recent Labs  Lab 09/11/17 0509 09/12/17 0520 09/13/17 0528  HGB 8.2* 8.5* 7.5*  HCT 26.3* 26.9* 24.4*  WBC 8.4 7.6 8.3  PLT 293 307 315   No results found.  ASSESSMENT / PLAN:  1.  Postobstructive pneumonia Status post bronchoscopy Currently on antibiotics-on cefepime-day 3 of antibiotics Cytology from bronchoscopy pending  2.  Hemoptysis This seems to be improving on present Status post transfusion  3.  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  Multiple lung nodules/masses  5.  Diastolic dysfunction   6.  Anemia Stable-mild drop noted  Will continue to follow  Concern for  hypoxemia was spurious--no new interventions, saturating well  Discussed with hospitalist-await biopsy results, continue current antibiotic therapy  [facility] [place] Pager: [phone]  09/13/2017, 12:35 PM

## 2017-09-13 NOTE — Progress Notes (Signed)
Physical Therapy Treatment Patient Details Name: Corey Dorsey MRN: [id number] DOB: [birth year] Today's Date: 09/13/2017    History of Present Illness 77 y.o. male with no significant past medical history and has not been to a doctor for many years started experiencing chest pain in the left anterior chest wall for the last few weeks.  Chest pain is present even at rest and increased on deep inspiration.  Has been examined productive cough sometimes blood-tinged mostly in the mornings.  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 Pt had CT-guided biopsy of Left lung mass on 09/07/17    PT Comments    Progressing with mobility. He fatigues fairly easily and remains unsteady. O2 sat >90% on 3L [place] O2, dyspnea 3/4 during ambulation. Will continue to follow.   Follow Up Recommendations  SNF     Equipment Recommendations  Rolling walker with 5" wheels    Recommendations for Other Services       Precautions / Restrictions Precautions Precautions: Fall Precaution Comments: monitor oxygen Restrictions Weight Bearing Restrictions: No    Mobility  Bed Mobility Overal bed mobility: Needs Assistance Bed Mobility: Supine to Sit;Sit to Supine     Supine to sit: Supervision;HOB elevated Sit to supine: Supervision;HOB elevated   General bed mobility comments: for safety, lines.   Transfers Overall transfer level: Needs assistance Equipment used: Rolling walker (2 wheeled) Transfers: Sit to/from Stand Sit to Stand: Min assist;From elevated surface         General transfer comment: Assist to rise, steady, control descent. VCS safety, hand placement.  Brief coughing spell initially.   Ambulation/Gait Ambulation/Gait assistance: Min assist Gait Distance (Feet): 115 Feet Assistive device: Rolling walker (2 wheeled) Gait Pattern/deviations: Step-through pattern;Decreased stride length     General Gait Details: Assist to  stabilize pt and maneuver safely with walker, especially towards end of distance. O2 sat 100% on 3L [place] O2, dyspnea 3/4.    Stairs             Wheelchair Mobility    Modified Rankin (Stroke Patients Only)       Balance Overall balance assessment: Needs assistance         Standing balance support: Bilateral upper extremity supported Standing balance-Leahy Scale: Poor                              Cognition Arousal/Alertness: Awake/alert Behavior During Therapy: WFL for tasks assessed/performed Overall Cognitive Status: Within Functional Limits for tasks assessed                                        Exercises      General Comments        Pertinent Vitals/Pain Pain Assessment: Faces Faces Pain Scale: Hurts little more Pain Location: chest from coughing Pain Intervention(s): Monitored during session;Repositioned    Home Living                      Prior Function            PT Goals (current goals can now be found in the care plan section) Progress towards PT goals: Progressing toward goals    Frequency    Min 2X/week      PT Plan Current plan remains appropriate    Co-evaluation  AM-PAC PT "6 Clicks" Daily Activity  Outcome Measure  Difficulty turning over in bed (including adjusting bedclothes, sheets and blankets)?: None Difficulty moving from lying on back to sitting on the side of the bed? : None Difficulty sitting down on and standing up from a chair with arms (e.g., wheelchair, bedside commode, etc,.)?: Unable Help needed moving to and from a bed to chair (including a wheelchair)?: A Little Help needed walking in hospital room?: A Little Help needed climbing 3-5 steps with a railing? : A Lot 6 Click Score: 17    End of Session Equipment Utilized During Treatment: Gait belt;Oxygen Activity Tolerance: Patient limited by fatigue Patient left: in bed;with call bell/phone within  reach;with bed alarm set   PT Visit Diagnosis: Difficulty in walking, not elsewhere classified (R26.2);Muscle weakness (generalized) (M62.81)     Time: 8550-1586 PT Time Calculation (min) (ACUTE ONLY): 22 min  Charges:  $Gait Training: 8-22 mins                    G Codes:          Weston Anna, MPT Pager: [phone]

## 2017-09-13 NOTE — Progress Notes (Signed)
CSW following to assist with discharge planning to SNF.  CSW followed up with patient/patient's daughter at bedside regarding bed offers and SNF selection. Patient's daughter reported that she has not made a decision. CSW encouraged patient's daughter to go tour some SNFs to assist with decision making process. CSW agreed to follow up with patient's daughter regarding SNF selection.  CSW will continue to follow and assist with discharge planning.  Abundio Miu, [place] Social Worker [facility] Cell#: [phone]

## 2017-09-13 NOTE — Progress Notes (Signed)
[place] Telephone:([phone]) [phone]   [phone]  CONSULT NOTE  REFERRING PHYSICIAN: Dr. Irine Seal  REASON FOR CONSULTATION:  77 years old African-American male recently diagnosed with lung cancer.  HPI Corey Dorsey is a 77 y.o. male very poor historian with past medical history significant for alcohol and tobacco abuse.  The patient was admitted to [facility] on 09/05/2017 complaining of pain on the left side of the anterior chest for several weeks.  He also has mild shortness of breath and blood-tinged productive cough.  He also has weight loss and lack of appetite.  During his evaluation he had CT angiogram of the chest performed on 09/05/2017 and it showed no evidence for pulmonary embolus but there was numerous bilateral lung nodules consistent with metastatic disease and the dominant mass measured 10.0 cm in the lingula suspicious for primary bronchogenic carcinoma.  There was also mildly enlarged with hilar lymph nodes concerning for nodal metastasis.  On 09/07/2017 he underwent CT-guided core biopsy of the left upper lobe lung mass by interventional radiology.  The final pathology ([id number]) showed necrosis with focal poorly differentiated carcinoma.  The immunohistochemical stains was positive for pancytokeratin and cytokeratin 8/18.  The cells were negative for cytokeratin 903, TTF-1, p63 and cytokeratin 5/6.  The morphology slightly favoring squamous cell carcinoma but there was insufficient tissue for additional studies.  The patient continues to have hemoptysis and he was evaluated by pulmonary medicine.  He is considered for bronchoscopy for airway inspection and localization of the bleeding area.  I was asked to see the patient today for evaluation and discussion of his treatment options. When seen today the patient continues to complain of pain on the anterior left side of the chest as well as shortness of breath and cough with  blood-tinged sputum.  He denied having any current fever or chills.  He has no nausea, vomiting, diarrhea or constipation.  He lost a lot of weight recently. Family history is unremarkable for any malignancy.  Father had tuberculosis. The patient is separated and has 1 daughter and 1 stepson.  He worked in several jobs including roofing.  He has a history of smoking for over 60 years.  He also used to drink alcohol at regular basis.  He denied having any history of drug abuse. HPI  Past Medical History:  Diagnosis Date  . Alcohol abuse   . Hearing loss   . Tobacco abuse     Past Surgical History:  Procedure Laterality Date  . ABDOMINAL SURGERY      Family History  Problem Relation Age of Onset  . Tuberculosis Father     Social History Social History   Tobacco Use  . Smoking status: Current Some Day Smoker    Packs/day: 0.15    Years: 58.00    Pack years: 8.70    Types: Cigarettes  . Smokeless tobacco: Never Used  Substance Use Topics  . Alcohol use: Yes    Alcohol/week: 1.2 oz    Types: 2 Cans of beer per week  . Drug use: No    No Known Allergies  Current Facility-Administered Medications  Medication Dose Route Frequency Provider Last Rate Last Dose  . acetaminophen (TYLENOL) tablet 650 mg  650 mg Oral Q6H PRN Rise Patience, MD   650 mg at 09/13/17 0429   Or  . acetaminophen (TYLENOL) suppository 650 mg  650 mg Rectal Q6H PRN Rise Patience, MD      .  ceFEPIme (MAXIPIME) 2 g in sodium chloride 0.9 % 100 mL IVPB  2 g Intravenous BID Purohit, Konrad Dolores, MD   Stopped at 09/13/17 1151  . ferrous sulfate tablet 325 mg  325 mg Oral Q breakfast Purohit, Shrey C, MD   325 mg at 09/13/17 0747  . lidocaine (LIDODERM) 5 % 1 patch  1 patch Transdermal Q24H Purohit, Shrey C, MD   1 patch at 09/13/17 1128  . ondansetron (ZOFRAN) tablet 4 mg  4 mg Oral Q6H PRN Rise Patience, MD       Or  . ondansetron [facility]) injection 4 mg  4 mg Intravenous Q6H PRN Rise Patience, MD      . oxyCODONE (Oxy IR/ROXICODONE) immediate release tablet 5 mg  5 mg Oral Q4H PRN Reyne Dumas, MD   5 mg at 09/13/17 1602  . zolpidem (AMBIEN) tablet 5 mg  5 mg Oral QHS PRN Gardiner Barefoot, NP   5 mg at 09/11/17 2301    Review of Systems  Constitutional: positive for anorexia, fatigue and weight loss Eyes: negative Ears, nose, mouth, throat, and face: negative Respiratory: positive for cough, dyspnea on exertion and hemoptysis Cardiovascular: negative Gastrointestinal: negative Genitourinary:negative Integument/breast: negative Hematologic/lymphatic: negative Musculoskeletal:positive for muscle weakness Neurological: negative Behavioral/Psych: negative Endocrine: negative Allergic/Immunologic: negative  Physical Exam  JSH:FWYOV, healthy, no distress, anxious and malnourished SKIN: skin color, texture, turgor are normal HEAD: Normocephalic, No masses, lesions, tenderness or abnormalities EYES: normal, PERRLA, Conjunctiva are pink and non-injected EARS: External ears normal, Canals clear OROPHARYNX:no exudate, no erythema and lips, buccal mucosa, and tongue normal  NECK: supple, no adenopathy, no JVD LYMPH:  no palpable lymphadenopathy, no hepatosplenomegaly LUNGS: coarse sounds heard, decreased breath sounds, prolonged expiratory phase HEART: regular rate & rhythm, no murmurs and no gallops ABDOMEN:abdomen soft, non-tender, normal bowel sounds and no masses or organomegaly BACK: Back symmetric, no curvature., No CVA tenderness EXTREMITIES:no joint deformities, effusion, or inflammation, no edema  NEURO: alert & oriented x 3 with fluent speech, no focal motor/sensory deficits  PERFORMANCE STATUS: ECOG 2  LABORATORY DATA: Lab Results  Component Value Date   WBC 8.3 09/13/2017   HGB 8.3 (L) 09/13/2017   HCT 27.3 (L) 09/13/2017   MCV 80.0 09/13/2017   PLT 315 09/13/2017    @LASTCHEM @  RADIOGRAPHIC STUDIES: Dg Chest 2 View  Result Date:  09/05/2017 CLINICAL DATA:  Hemoptysis.  History of smoking. EXAM: CHEST - 2 VIEW COMPARISON:  Prior report 11/09/1998. FINDINGS: Multiple bilateral pulmonary mass lesions and infiltrates are present. Findings are suspicious for metastatic disease with possible superimposed pneumonia. Small bilateral pleural effusions, left side greater than right. Elevation left hemidiaphragm. No pneumothorax. Heart size normal. No acute bony abnormality. IMPRESSION: Multiple bilateral pulmonary mass lesions infiltrates are present. Findings suspicious for metastatic disease with possible superimposed pneumonia. Small bilateral pleural effusions. Contrast-enhanced chest CT should be considered for further evaluation. Electronically Signed   By: Marcello Moores  Register   On: 09/05/2017 15:03   Ct Angio Chest Pe W Or Wo Contrast  Result Date: 09/10/2017 CLINICAL DATA:  Hemoptysis.  Multiple pulmonary tumors. EXAM: CT ANGIOGRAPHY CHEST WITH CONTRAST TECHNIQUE: Multidetector CT imaging of the chest was performed using the standard protocol during bolus administration of intravenous contrast. Multiplanar CT image reconstructions and MIPs were obtained to evaluate the vascular anatomy. CONTRAST:  128mL ISOVUE-370 IOPAMIDOL (ISOVUE-370) INJECTION 76% COMPARISON:  CT-guided biopsy 09/07/2017 Chest CTA 09/05/2017 FINDINGS: Cardiovascular: --Pulmonary arteries: Contrast injection is sufficient to demonstrate satisfactory opacification of  the pulmonary arteries to the segmental level. There is no pulmonary embolus. The main pulmonary artery is within normal limits for size. --Aorta: Satisfactory opacification of the thoracic aorta. No aortic dissection or other acute aortic syndrome. Conventional 3 vessel aortic branching pattern. The aortic course and caliber are normal. There is mild aortic atherosclerosis. --Heart: Normal size. No pericardial effusion. Mediastinum/Nodes: Level 4R nodes measure up to 12 mm. Level 6 nodes measure up to 10 mm.  Visualized thyroid is normal. Normal course of the esophagus. No axillary lymphadenopathy. Lungs/Pleura: The dominant mass within the left upper lobe measures 10.7 x 8.4 cm, unchanged in size. The mass encases multiple pulmonary arterial branches. There are numerous other pulmonary nodules scattered throughout both lungs. The largest nodule in the right measures 2.9 cm. Small left pleural effusion. Multiple areas of reticular opacity within both lungs has worsened, most notably in the right upper and middle lobes and the left upper lobe. The lingual bronchus is severely narrowed by the dominant mass, but unchanged from the prior study. Upper Abdomen: Contrast bolus timing is not optimized for evaluation of the abdominal organs. Within this limitation, the visualized organs of the upper abdomen are normal. Musculoskeletal: No chest wall abnormality. No acute or significant osseous findings. Review of the MIP images confirms the above findings. IMPRESSION: 1. No pulmonary embolus. 2. Unchanged size of dominant left upper lobe mass measuring up to 10.7 cm, narrowing the lingular bronchus and encasing multiple small pulmonary artery branches. This is a probable source of hemoptysis. 3. Numerous bilateral pulmonary metastatic nodules, which are also potential sources of hemoptysis. 4.  Aortic Atherosclerosis (ICD10-I70.0). Electronically Signed   By: Ulyses Jarred M.D.   On: 09/10/2017 17:04   Ct Angio Chest Pe W And/or Wo Contrast  Result Date: 09/05/2017 CLINICAL DATA:  Intermittent chest pain for 2 months. Hemoptysis. Decreased appetite. History of smoking. EXAM: CT ANGIOGRAPHY CHEST WITH CONTRAST TECHNIQUE: Multidetector CT imaging of the chest was performed using the standard protocol during bolus administration of intravenous contrast. Multiplanar CT image reconstructions and MIPs were obtained to evaluate the vascular anatomy. CONTRAST:  182mL ISOVUE-370 IOPAMIDOL (ISOVUE-370) INJECTION 76% COMPARISON:  Chest  radiograph 09/05/2017 FINDINGS: Cardiovascular: Pulmonary arterial opacification is adequate without evidence of emboli. Mild thoracic aortic atherosclerosis is noted without aneurysm. The heart is normal in size. Coronary artery atherosclerosis is noted. There is a trace pericardial effusion. Mediastinum/Nodes: Bilateral gynecomastia. Scattered subcentimeter mediastinal lymph nodes, the largest measuring 9 mm in short axis in the AP window. Enlarged hilar lymph nodes measure up to 14 mm on the left. Lungs/Pleura: A large mass in the lingula measures 10.6 x 8.6 x 9.2 cm (AP x transverse x craniocaudal) and extends to both the lateral and mediastinal pleural surfaces with some adjacent pleural thickening. A trace left pleural effusion versus pleural thickening is noted posteriorly in the left lower hemithorax. There are innumerable pulmonary nodules throughout both lungs. An anterior right upper lobe mass extending to the minor fissure measures 3.9 x 2.6 cm. Moderate centrilobular and paraseptal emphysema is noted. Areas of ground-glass opacity and interlobular septal thickening are present bilaterally, greatest in the left upper lobe and left lower lobe. Upper Abdomen: Suspected stone in the gallbladder, incompletely imaged. Musculoskeletal: No suspicious lytic or blastic osseous lesion. Mild anterior wedging of the T12 vertebral body, most likely chronic. Review of the MIP images confirms the above findings. IMPRESSION: 1. No evidence of pulmonary emboli. 2. 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 Dominant 10 cm lingular  mass may reflect primary bronchogenic carcinoma. 3. Mildly enlarged hilar lymph nodes, concerning for nodal metastases. 4. Aortic Atherosclerosis (ICD10-I70.0) and Emphysema (ICD10-J43.9). Electronically Signed   By: Logan Bores M.D.   On: 09/05/2017 16:08   Ct Shoulder Left Wo Contrast  Result Date: 09/07/2017 CLINICAL DATA:  Left shoulder pain EXAM: CT OF THE UPPER LEFT  EXTREMITY WITHOUT CONTRAST TECHNIQUE: Multidetector CT imaging of the upper left extremity was performed according to the standard protocol. COMPARISON:  09/05/2017 CT chest FINDINGS: Bones/Joint/Cartilage Flattening of the posterolateral humeral head which could be due to Hill-Sachs impaction. Degenerative glenohumeral spurring with some chronic fragmentation along the posterosuperior glenoid. Chondrocalcinosis of the glenoid labrum. Subacromial morphology is type 2 (curved). Ligaments Suboptimally assessed by CT. Muscles and Tendons Chronic appearing atrophy of the supraspinatus and infraspinatus muscles favoring chronic rotator cuff ruptures. Soft tissues No obvious mass lesions along visualized segment of the brachial plexus. Extensive tumor burden the left chest. Severe emphysema. No obvious regional bony metastatic lesions; the ribs are blurred by motion artifact. Scattered airspace opacities especially in the left upper lobe. IMPRESSION: 1. 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 2. Flattening of the posterolateral humeral head which may be from remote Hill-Sachs impaction. 3. Extensive tumor burden in the left chest with severe emphysema. 4. Airspace opacity in the left upper lobe, potentially from pneumonia. 5. 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 Mild degenerative glenohumeral spurring. Electronically Signed   By: Van Clines M.D.   On: 09/07/2017 18:25   Ct Biopsy  Result Date: 09/07/2017 CLINICAL DATA:  Large left upper lobe/lingular lung mass with multiple additional bilateral pulmonary masses. EXAM: CT GUIDED CORE BIOPSY OF LEFT LUNG MASS ANESTHESIA/SEDATION: 1.5 mg IV Versed; 50 mcg IV Fentanyl Total Moderate Sedation Time:  10 minutes. The patient's level of consciousness and physiologic status were continuously monitored during the procedure by Radiology  nursing. PROCEDURE: The procedure risks, benefits, and alternatives were explained to the patient. Questions regarding the procedure were encouraged and answered. The patient understands and consents to the procedure. A time-out was performed prior to initiating the procedure. CT was performed through the chest in a supine position. The left anterior chest wall was prepped with chlorhexidine in a sterile fashion, and a sterile drape was applied covering the operative field. A sterile gown and sterile gloves were used for the procedure. Local anesthesia was provided with 1% Lidocaine. A 17 gauge trocar needle was advanced into a left lung mass under CT guidance. Three separate coaxial 18 gauge core biopsy samples were obtained and submitted in formalin. Additional CT images were performed. COMPLICATIONS: None FINDINGS: Large left-sided lung mass centered in the lower left upper lobe and extending into the lingula measures approximately 10.8 cm in maximum diameter. Solid tissue was obtained. IMPRESSION: CT-guided biopsy performed of the large left-sided lung mass measuring nearly 11 cm in diameter. Electronically Signed   By: Aletta Edouard M.D.   On: 09/07/2017 17:22   Dg Chest Port 1 View  Result Date: 09/10/2017 CLINICAL DATA:  Continued small amount of hemoptysis. Status post left lung mass biopsy on 09/07/2017. EXAM: PORTABLE CHEST 1 VIEW COMPARISON:  09/05/2017, chest CTA dated 09/05/2017 and CT guided left lung biopsy dated 09/07/2017. FINDINGS: The previously demonstrated large left lung mass appears larger and more confluent than on 09/05/2017. Multiple smaller masses are again demonstrated elsewhere in both lungs. Interval patchy densities in both lungs. This appears predominantly interstitial  with some airspace component. No pneumothorax. Mild scoliosis. Diffuse osteopenia. Normal sized heart. IMPRESSION: 1. Interval increase in size and density of the previously demonstrated large left lung mass. 2.  Multiple smaller bilateral lung masses compatible with metastases are again demonstrated. 3. Interval patchy opacities in both lungs. Differential considerations include pneumonia, pneumonitis, pulmonary edema, lymphangitic spread of tumor and hemorrhage. Electronically Signed   By: Claudie Revering M.D.   On: 09/10/2017 09:15    ASSESSMENT: This is a very pleasant 77 years old African-American male recently diagnosed with metastatic non-small cell lung cancer, favoring squamous cell carcinoma presented with large lingular mass as well as multiple bilateral pulmonary nodules as well as hilar lymphadenopathy diagnosed in July 2019.   PLAN: I had a lengthy discussion with the patient today about his current disease stage, prognosis and treatment options.  Unfortunately the patient has poor performance status and his disease is very extensive with involvement of both lungs with innumerable pulmonary nodules. I discussed with the patient his treatment options including palliative care and hospice referral versus consideration of palliative systemic chemotherapy.  I do not think the patient would be a great candidate for systemic chemotherapy taken into consideration his poor performance status.  I would strongly recommend for him to consider palliative care at this point. For symptomatic management the patient may benefit from short course of palliative radiotherapy to the large mass to help decrease the hemoptysis. He has some pain in his right ear and he also confused at times.  He may benefit from CT scan of the head with and without contrast to rule out brain metastasis.  I do not think the patient will be able to tolerate MRI of the brain because of his condition. The patient voices understanding of current disease status and treatment options and is in agreement with the current care plan.  All questions were answered. The patient knows to call the clinic with any problems, questions or concerns. We can  certainly see the patient much sooner if necessary.  Thank you so much for allowing me to participate in the care of Corey Dorsey. I will continue to follow up the patient with you and assist in his care.  Please call if you have any questions.   Disclaimer: This note was dictated with voice recognition software. Similar sounding words can inadvertently be transcribed and may not be corrected upon review.   Eilleen Kempf September 13, 2017, 5:19 PM

## 2017-09-14 ENCOUNTER — Encounter (HOSPITAL_COMMUNITY): Payer: Self-pay | Admitting: Pulmonary Disease

## 2017-09-14 LAB — CBC WITH DIFFERENTIAL/PLATELET
BASOS PCT: 0 %
Basophils Absolute: 0 10*3/uL (ref 0.0–0.1)
EOS ABS: 0.1 10*3/uL (ref 0.0–0.7)
Eosinophils Relative: 2 %
HEMATOCRIT: 24.3 % — AB (ref 39.0–52.0)
HEMOGLOBIN: 7.5 g/dL — AB (ref 13.0–17.0)
Lymphocytes Relative: 13 %
Lymphs Abs: 1.1 10*3/uL (ref 0.7–4.0)
MCH: 24.8 pg — ABNORMAL LOW (ref 26.0–34.0)
MCHC: 30.9 g/dL (ref 30.0–36.0)
MCV: 80.5 fL (ref 78.0–100.0)
Monocytes Absolute: 0.4 10*3/uL (ref 0.1–1.0)
Monocytes Relative: 5 %
NEUTROS ABS: 6.6 10*3/uL (ref 1.7–7.7)
NEUTROS PCT: 80 %
Platelets: 298 10*3/uL (ref 150–400)
RBC: 3.02 MIL/uL — ABNORMAL LOW (ref 4.22–5.81)
RDW: 16.3 % — AB (ref 11.5–15.5)
WBC: 8.2 10*3/uL (ref 4.0–10.5)

## 2017-09-14 LAB — HEMOGLOBIN AND HEMATOCRIT, BLOOD
HEMATOCRIT: 27.3 % — AB (ref 39.0–52.0)
HEMOGLOBIN: 8.5 g/dL — AB (ref 13.0–17.0)

## 2017-09-14 LAB — BASIC METABOLIC PANEL
Anion gap: 8 (ref 5–15)
BUN: 17 mg/dL (ref 8–23)
CHLORIDE: 103 mmol/L (ref 98–111)
CO2: 23 mmol/L (ref 22–32)
CREATININE: 1.02 mg/dL (ref 0.61–1.24)
Calcium: 8.3 mg/dL — ABNORMAL LOW (ref 8.9–10.3)
GFR calc non Af Amer: >60 mL/min (ref >60)
Glucose, Bld: 109 mg/dL — ABNORMAL HIGH (ref 70–99)
Potassium: 4.4 mmol/L (ref 3.5–5.1)
SODIUM: 134 mmol/L — AB (ref 135–145)

## 2017-09-14 LAB — MAGNESIUM: Magnesium: 2.1 mg/dL (ref 1.7–2.4)

## 2017-09-14 LAB — PREPARE RBC (CROSSMATCH)

## 2017-09-14 MED ORDER — SODIUM CHLORIDE 0.9 % IV SOLN
510.0000 mg | Freq: Once | INTRAVENOUS | Status: AC
  Administered 2017-09-14: 510 mg via INTRAVENOUS
  Filled 2017-09-14: qty 17

## 2017-09-14 MED ORDER — SODIUM CHLORIDE 0.9% IV SOLUTION
Freq: Once | INTRAVENOUS | Status: AC
  Administered 2017-09-14: 14:00:00 via INTRAVENOUS

## 2017-09-14 MED ORDER — DIPHENHYDRAMINE HCL 25 MG PO CAPS
25.0000 mg | ORAL_CAPSULE | Freq: Once | ORAL | Status: AC
  Administered 2017-09-14: 25 mg via ORAL
  Filled 2017-09-14: qty 1

## 2017-09-14 MED ORDER — LEVETIRACETAM 500 MG PO TABS
500.0000 mg | ORAL_TABLET | Freq: Two times a day (BID) | ORAL | Status: DC
Start: 2017-09-14 — End: 2017-09-19
  Administered 2017-09-14 – 2017-09-19 (×11): 500 mg via ORAL
  Filled 2017-09-14 (×11): qty 1

## 2017-09-14 MED ORDER — ACETAMINOPHEN 325 MG PO TABS
650.0000 mg | ORAL_TABLET | Freq: Once | ORAL | Status: AC
  Administered 2017-09-14: 650 mg via ORAL
  Filled 2017-09-14: qty 2

## 2017-09-14 NOTE — Care Management Note (Signed)
Case Management Note  Patient Details  Name: Corey Dorsey MRN: [id number] Date of Birth: [birth year]  Subjective/Objective:  Full code.Noted ct brain results. Oncology/pulmonary following-brain mets-Palliative cons-await recc.                  Action/Plan:d/c plan SNF   Expected Discharge Date:                  Expected Discharge Plan:  Skilled Nursing Facility  In-House Referral:  Clinical Social Work  Discharge planning Services  CM Consult  Post Acute Care Choice:    Choice offered to:     DME Arranged:    DME Agency:     HH Arranged:    [place] Agency:     Status of Service:  In process, will continue to follow  If discussed at Long Length of Stay Meetings, dates discussed:    Additional Comments:  Dessa Phi, RN 09/14/2017, 12:23 PM

## 2017-09-14 NOTE — Care Management Important Message (Signed)
Important Message  Patient Details  Name: Corey Dorsey MRN: [id number] Date of Birth: 07-01-1940   Medicare Important Message Given:  Yes    Kerin Salen 09/14/2017, 11:02 AMImportant Message  Patient Details  Name: Corey Dorsey MRN: [id number] Date of Birth: 09/07/1940   Medicare Important Message Given:  Yes    Kerin Salen 09/14/2017, 11:02 AM

## 2017-09-14 NOTE — Progress Notes (Addendum)
Name: Corey Dorsey MRN: [id number] DOB: 08-05-1940    ADMISSION DATE:  09/05/2017 CONSULTATION DATE: 09/06/2017  CHIEF COMPLAINT: Abnormal CT chest/ hemoptysis  BRIEF PATIENT DESCRIPTION:  77 year old smoker with lung mass, multiple lung nodules status post biopsy Postobstructive pneumonia Biopsy results pending  SIGNIFICANT EVENTS  Status post bronchoscopy on 09/12/2017-significant for bleeding notable in the left upper lobe Status post biopsy-path pending  STUDIES:  09/13/2017>> MRI brain>>1. 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 MRI of the brain with contrast may demonstrate additional metastasis.  09/10/2017>>CT scan of the chest >> dominant left upper lobe mass measuring up to 10.7 cm, narrowing the lingular bronchus and encasing multiple small pulmonary artery branches. This is a probable source of hemoptysis. Numerous bilateral pulmonary metastatic nodules, which are also potential sources of hemoptysis.  09/12/2017>>Bronch : The airway examination of the right lung was normal. - Blood was present in the anterior segment of the left upper lobe (B3). - A blood clot was found in the anterior segment of the left upper lobe (B3). - Brushings were obtained. - Bronchoalveolar lavage was performed. Findings: Left Lung Abnormalities: Fresh blood was found in the anterior segment of the left upper lobe (B3). The bleeding site was identified. A blot clot was found in the anterior segment of the left upper lobe (B3). It was partially obstructing the airway.   09/07/2017 Lung, needle/core biopsy(ies), left - NECROSIS WITH FOCAL POORLY DIFFERENTIATED CARCINOMA The majority of the tissue is necrotic. There is only of small focus of malignant cells.  The morphology slightly favors a squamous cell carcinoma. There is insufficient tissue for additional studies. 09/06/2017 Echo EF 13-08%, Grade 1 diastolic dysfunction PAP 31 mm Hg RA moderately dilated Atrial  septum: There was a probable, large patent foramen ovale. - Tricuspid valve: There was mild regurgitation.  HISTORY OF PRESENT ILLNESS:   77 year old with no medical history admitted after experiencing anterior chest pain and pleuritic chest pain and hemoptysis. He was found to have elevated troponin,  lung mass with multiple metastases who is status post biopsy on 09/07/2017. Pathology favors squamous cell carcinoma.  SUBJECTIVE:  Continued  hemoptysis post bronchoscopy 09/07/2017 with drop in HGB 7/25 States he continues to cough up bloody secretions Surgical pathology favors squamous cell carcinoma Remains on 3 L [place] with saturation of 94%   VITAL SIGNS: Temp:  [97.9 F (36.6 C)-98.3 F (36.8 C)] 98.1 F (36.7 C) (07/25 0545) Pulse Rate:  [89-108] 101 (07/25 0545) Resp:  [20-24] 24 (07/25 0545) BP: (96-117)/(64-70) 96/64 (07/25 0545) SpO2:  [93 %-100 %] 93 % (07/25 0545)  PHYSICAL EXAMINATION: General: Alert and oriented x3,pleasant older male supine in bed Neuro: Cranial extremities with no focal findings, forgetful per nursing staff HEENT: No nasal lesions noted, no oral lesions noted, NCAT, No LAD Cardiovascular: S1-S2 appreciated,RRR, No  murmurs, rub or gallop  Lungs: Scattered Rhonchi, basilar rales. L>R, No accessory muscle use noted, no tenderness Abdomen: Soft, NT, ND, bowel sounds appreciated, thin Musculoskeletal: Moving all extremities, No obvious deformities Skin: No skin lesions noted, No rash  Recent Labs  Lab 09/12/17 0520 09/13/17 0528 09/14/17 0521  NA 133* 134* 134*  K 4.8 4.6 4.4  CL 102 103 103  CO2 _0 BUN _1 CREATININE 0.99 1.07 1.02  GLUCOSE 84 85 109*   Recent Labs  Lab 09/12/17 0520 09/13/17 0528 09/13/17 1426 09/14/17 0521  HGB 8.5* 7.5* 8.3* 7.5*  HCT 26.9* 24.4* 27.3* 24.3*  WBC 7.6 8.3  --  8.2  PLT 307 315  --  298   Ct Head W & Wo Contrast  Result Date: 09/13/2017 CLINICAL DATA:  Lung cancer staging. EXAM: CT HEAD  WITHOUT AND WITH CONTRAST TECHNIQUE: Contiguous axial images were obtained from the base of the skull through the vertex without and with intravenous contrast CONTRAST:  174m ISOVUE-300 IOPAMIDOL (ISOVUE-300) INJECTION 61% COMPARISON:  MRI of the head November 17, 2011 FINDINGS: BRAIN: 14 x 16 mm RIGHT paramedian parietal mass with intrinsic rim density and superimposed marginal enhancement. Surrounding low-density vasogenic edema. 4 mm enhancing nodule LEFT frontal gray-white matter junction (series 6, image 17). No midline shift or mass effect. No acute large vascular territory infarct. No parenchymal brain volume loss for age. No hydrocephalus. Patchy supratentorial white matter hypodensities compatible with mild chronic small vessel ischemic changes, less than expected for age. VASCULAR: Moderate calcific atherosclerosis of the carotid siphons. SKULL: No skull fracture. No significant scalp soft tissue swelling. SINUSES/ORBITS: Chronic LEFT maxillary sinusitis. Minimal RIGHT mastoid effusion.The included ocular globes and orbital contents are non-suspicious. OTHER: None. IMPRESSION: 1. 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 MRI of the brain with contrast may demonstrate additional metastasis. 2. No acute intracranial process. Electronically Signed   By: CElon AlasM.D.   On: 09/13/2017 19:45    ASSESSMENT / PLAN:  Postobstructive pneumonia Status post bronchoscopy 7/18 Currently on antibiotics-on cefepime-day 4 of antibiotics Cytology from bronchoscopy  favors squamous cell carcinoma Plan: Continue ABX as noted CXR 7/26 f/u pneumonia  Hypoxemia>> Improving Plan: Titrate oxygen for sats 88-92% Pulmonary toilet as patient can tolerate   Hemoptysis/ Anemia Continues to cough up blood tinged secretions HGB drop 7/25 to 7.5 from 8.3 ( Does not appear to be hemodilutional Status post transfusion 7/21 Currently receiving iron dextran Plan: Trend CBC Transfuse per  primary team   Lung mass-biopsied 09/07/2017 :Pathology favors squamous cell carcinoma Seen by Dr. MEarlie Server7/24>> Does not feel candidate for systemic chemo >> recommends palliative care with palliative radiotherapy to largest mass to decrease hemoptysis. 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 Agree with palliative care goals  and palliative  radiotherapy to large mass to decrease hemoptysis.   Palliation discussions are ongoing with family.  SMagdalen Spatz AGACNP-BC Pulmonary and CJosephPager: [phone] 09/14/2017, 10:10 AM

## 2017-09-14 NOTE — Progress Notes (Signed)
PROGRESS NOTE    Corey Dorsey  AOZ:[id number] DOB: 02/22/1941 DOA: 09/05/2017 PCP: Golden Circle, FNP    Brief Narrative:  77 year old with no medical history admitted after experiencing anterior chest pain and pleuritic chest pain and found to have elevated troponin lung mass with multiple metastases who is status post biopsy on 09/07/2017.      Assessment & Plan:   Principal Problem:   Lung mass Active Problems:   Tobacco use disorder   Shortness of breath   Elevated troponin   Microcytic hypochromic anemia   Hemoptysis  1 fever secondary to postobstructive pneumonia Patient likely with postobstructive pneumonia as CT chest with lung mass and pulmonary nodules concerning for mets.  Patient had repeat CT angiogram done 09/10/2017 showing worsening opacities.  Patient noted to have a temperature up to 101 09/10/2017.  Patient placed on IV cefepime which we will continue for now.  Blood cultures pending.  Continue scheduled nebulizers. Follow.  2.  Hemoptysis Patient noted to have small volumes of hemoptysis.  Status post transfusion of 1 unit packed red blood cells 09/10/2017. Hemoglobin currently at 7.5.  It is noted that patient's hemoptysis has been occurring for several weeks prior to biopsy which was done.  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 IR was consulted and recommended discussion with pulmonary about localizing hemoptysis prior to consideration of called embolization if patient were to have significant worsening hemoptysis.   Patient seen again by pulmonary on 09/12/2017 and patient subsequently underwent bronchoscopy with brushings taken.  Per pulmonary bronchoscopy showed bleeding from anterior segment of the left upper lobe.  Per pulmonary if patient develops massive hemoptysis could target this area for IR embolization.  Brushings pending.  Per  pulmonary.  If worsening hemoptysis and ongoing with drops in hemoglobin may need palliative radiation.  3.  Lung mass status post biopsy 09/07/2017.  Concerning for bronchogenic carcinoma with diffuse mets.  Patient with ongoing hemoptysis.  Patient underwent bronchoscopy 09/12/2017 with brushings taken and results pending.  Pulmonary following.  Biopsies done by interventional radiology with pathology with necrosis with focal poorly differentiated carcinoma.  Patient with metastatic disease.  CT head consistent with mets.  Patient seen by oncology and feel patient is a poor candidate for any chemotherapy and recommending palliative care.  Place patient on Keppra for seizure prophylaxis.  May need palliative radiation with ongoing hemoptysis.  Palliative care consultation pending.    4.  Hypoxia Was called by RN as patient noted to be hypoxic with sats in the 70s to 80s despite 6 L nasal cannula on 09/13/2017.  Patient noted to be hypoxic the night of 09/12/2017, with sats of 79% and patient noted to not have nasal cannula incorrectly.  Nasal cannula placed and patient noted to have sats in the 80s and O2 had to be increased to 5 L with sats of 92% overnight.  On evaluation of patient on 09/13/2017 patient with no significant distress.  O2 sats was previous as patient noted to have clubbing 1 pulse ox was moved to the earlobe O2 sats immediately improved to 100%.  Wean O2.  Continue empiric IV antibiotics for probable postobstructive pneumonia.  Continue scheduled nebs.    5.  Elevated troponin 2D echo with normal EF and grade 1 diastolic dysfunction.  CT chest did show significant calcific disease.  Patient seen in consultation by cardiology who feel patient is not a  very good candidate for intervention at this time particularly with his hemoptysis and possibly limited life expectancy.  Follow.  6.  Iron deficiency anemia/anemia of chronic disease/acute blood loss anemia Status post 1 unit packed red blood  cells 09/10/2017.  Hemoglobin currently stable at 7. 5.  Patient with ongoing hemoptysis.  Patient currently receiving IV Feraheme.  Transfuse 1 unit packed red blood cells.  Follow H&H.     DVT prophylaxis: SCDs Code Status: Full Family Communication updated patient.  No family at bedside. Disposition Plan: Pending work-up and consultation with palliative care.  May need skilled nursing facility with palliative care following.   Consultants:   PCCM Dr. Lynetta Mare 09/06/2017  Cardiology Dr. Harrington Challenger 09/06/2017  Interventional radiology  Palliative care: Dr. Rowe Pavy 09/14/2017  Oncology: Dr. Lorna Few 09/13/2017  Procedures:   CT angiogram chest 09/10/2017, 09/05/2017  CT left shoulder 09/07/2017  CT-guided core biopsy left lung mass per IR Dr. Kathlene Cote 09/07/2017  Chest x-ray 09/05/2017, 09/10/2017  2D echo 09/06/2017  Bronchoscopy per Dr. Lake Bells 09/12/2017  Transfuse 1 unit packed red blood cells 09/10/2017.  Transfuse 1 unit packed red blood cells 09/14/2017  CT head with and without contrast 09/13/2017.  Antimicrobials:   IV cefepime 09/11/2017     Subjective: Patient laying in bed.  Still with some hemoptysis however states is slowly improving and slowly clearing up.  O2 sats improved.  Denies any shortness of breath.  Complains of some left-sided rib pain.  Objective: Vitals:   09/13/17 0640 09/13/17 1357 09/13/17 2049 09/14/17 0545  BP:  117/70 110/64 96/64  Pulse:  89 (!) 108 (!) 101  Resp:  20 (!) 22 (!) 24  Temp:  97.9 F (36.6 C) 98.3 F (36.8 C) 98.1 F (36.7 C)  TempSrc:  Oral Oral Oral  SpO2:  100% 93% 93%  Weight: 57.8 kg (127 lb 6.8 oz)     Height:        Intake/Output Summary (Last 24 hours) at 09/14/2017 1114 Last data filed at 09/14/2017 0100 Gross per 24 hour  Intake 440 ml  Output 925 ml  Net -485 ml   Filed Weights   09/12/17 0522 09/12/17 1357 09/13/17 0640  Weight: 57.7 kg (127 lb 3.3 oz) 57.6 kg (127 lb) 57.8 kg (127 lb 6.8 oz)     Examination:  General exam: Frail. Respiratory system: Bibasilar crackles.  Some coarse breath sounds.  No wheezing noted.  Normal respiratory effort. Cardiovascular system: RRR no murmurs rubs or gallops.  No lower extremity edema.  No JVD.   Gastrointestinal system: Abdomen is nontender, nondistended, soft, positive bowel sounds.  No rebound.  No guarding.   Central nervous system: Alert and oriented. No focal neurological deficits. Extremities: Symmetric 5 x 5 power. Skin: No rashes, lesions or ulcers Psychiatry: Judgement and insight appear poor to fair. Mood & affect appropriate.     Data Reviewed: I have personally reviewed following labs and imaging studies  CBC: Recent Labs  Lab 09/10/17 0541  09/11/17 0509 09/12/17 0520 09/13/17 0528 09/13/17 1426 09/14/17 0521  WBC 7.2  --  8.4 7.6 8.3  --  8.2  NEUTROABS  --   --   --   --   --   --  6.6  HGB 7.1*   < > 8.2* 8.5* 7.5* 8.3* 7.5*  HCT 22.5*   < > 26.3* 26.9* 24.4* 27.3* 24.3*  MCV 78.9  --  79.2 80.1 80.0  --  80.5  PLT 276  --  293 307 315  --  298   < > = values in this interval not displayed.   Basic Metabolic Panel: Recent Labs  Lab 09/10/17 0541 09/12/17 0520 09/13/17 0528 09/14/17 0521  NA 134* 133* 134* 134*  K 4.4 4.8 4.6 4.4  CL 101 102 103 103  CO2 27 24 24 23   GLUCOSE 86 84 85 109*  BUN 17 16 19 17   CREATININE 1.05 0.99 1.07 1.02  CALCIUM 8.5* 8.4* 8.2* 8.3*  MG 2.3  --   --  2.1   GFR: Estimated Creatinine Clearance: 48.8 mL/min (by C-G formula based on SCr of 1.02 mg/dL). Liver Function Tests: No results for input(s): AST, ALT, ALKPHOS, BILITOT, PROT, ALBUMIN in the last 168 hours. No results for input(s): LIPASE, AMYLASE in the last 168 hours. No results for input(s): AMMONIA in the last 168 hours. Coagulation Profile: No results for input(s): INR, PROTIME in the last 168 hours. Cardiac Enzymes: No results for input(s): CKTOTAL, CKMB, CKMBINDEX, TROPONINI in the last 168  hours. BNP (last 3 results) No results for input(s): PROBNP in the last 8760 hours. HbA1C: No results for input(s): HGBA1C in the last 72 hours. CBG: No results for input(s): GLUCAP in the last 168 hours. Lipid Profile: No results for input(s): CHOL, HDL, LDLCALC, TRIG, CHOLHDL, LDLDIRECT in the last 72 hours. Thyroid Function Tests: No results for input(s): TSH, T4TOTAL, FREET4, T3FREE, THYROIDAB in the last 72 hours. Anemia Panel: No results for input(s): VITAMINB12, FOLATE, FERRITIN, TIBC, IRON, RETICCTPCT in the last 72 hours. Sepsis Labs: Recent Labs  Lab 09/10/17 1707 09/11/17 0509 09/12/17 0520  PROCALCITON 0.18 0.20 0.15    Recent Results (from the past 240 hour(s))  Culture, blood (routine x 2)     Status: None (Preliminary result)   Collection Time: 09/11/17  8:38 AM  Result Value Ref Range Status   Specimen Description   Final    BLOOD LEFT ANTECUBITAL Performed at [facility], [place] [address], [place], [place] [address]    Special Requests   Final    BOTTLES DRAWN AEROBIC AND ANAEROBIC Blood Culture results may not be optimal due to an excessive volume of blood received in culture bottles Performed at [facility] [address], [place], [place] [address]    Culture   Final    NO GROWTH 2 DAYS Performed at [facility] [address] [place], [place] [address]    Report Status PENDING  Incomplete  Culture, blood (routine x 2)     Status: None (Preliminary result)   Collection Time: 09/11/17  8:48 AM  Result Value Ref Range Status   Specimen Description   Final    BLOOD RIGHT HAND Performed at [facility] [address] [place], [place] [address]    Special Requests   Final    BOTTLES DRAWN AEROBIC AND ANAEROBIC Blood Culture adequate volume Performed at [facility] [address], [place], [place] [address]    Culture   Final    NO GROWTH 2 DAYS Performed at [facility] [address], [place],  [address]    Report Status PENDING  Incomplete         Radiology Studies: Ct Head W & Wo Contrast  Result Date: 09/13/2017 CLINICAL DATA:  Lung cancer staging. EXAM: CT HEAD WITHOUT AND WITH CONTRAST TECHNIQUE: Contiguous axial images were obtained from the base of the skull through the vertex without  and with intravenous contrast CONTRAST:  14mL ISOVUE-300 IOPAMIDOL (ISOVUE-300) INJECTION 61% COMPARISON:  MRI of the head November 17, 2011 FINDINGS: BRAIN: 14 x 16 mm RIGHT paramedian parietal mass with intrinsic rim density and superimposed marginal enhancement. Surrounding low-density vasogenic edema. 4 mm enhancing nodule LEFT frontal gray-white matter junction (series 6, image 17). No midline shift or mass effect. No acute large vascular territory infarct. No parenchymal brain volume loss for age. No hydrocephalus. Patchy supratentorial white matter hypodensities compatible with mild chronic small vessel ischemic changes, less than expected for age. VASCULAR: Moderate calcific atherosclerosis of the carotid siphons. SKULL: No skull fracture. No significant scalp soft tissue swelling. SINUSES/ORBITS: Chronic LEFT maxillary sinusitis. Minimal RIGHT mastoid effusion.The included ocular globes and orbital contents are non-suspicious. OTHER: None. IMPRESSION: 1. 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 MRI of the brain with contrast may demonstrate additional metastasis. 2. No acute intracranial process. Electronically Signed   By: Elon Alas M.D.   On: 09/13/2017 19:45        Scheduled Meds: . sodium chloride   Intravenous Once  . acetaminophen  650 mg Oral Once  . diphenhydrAMINE  25 mg Oral Once  . ferrous sulfate  325 mg Oral Q breakfast  . levETIRAcetam  500 mg Oral BID  . lidocaine  1 patch Transdermal Q24H   Continuous Infusions: . ceFEPime (MAXIPIME) IV Stopped (09/14/17 1016)     LOS: 9 days    Time spent: 40  minutes    Irine Seal, MD [facility] Pager [phone]  If 7PM-7AM, please contact night-coverage [email or web address] Password [email or web address] 09/14/2017, 11:14 AM

## 2017-09-14 NOTE — Consult Note (Signed)
Consultation Note Date: 09/14/2017   Patient Name: Corey Dorsey  DOB: 06-29-1940  MRN: [id number]  Age / Sex: 77 y.o., male  PCP: Golden Circle, FNP Referring Physician: Eugenie Filler, MD  Reason for Consultation: Establishing goals of care  HPI/Patient Profile: 78 y.o. male admitted on 09/05/2017   Clinical Assessment and Goals of Care:  Mr. Matters is a 77 year old gentleman who was living at home, along with his nieces, patient has a daughter who lives in [place], [place]. Patient has a history of ETOH use, history of cigarette smoking. Patient has been hospitalized for further workup of lung mass, multiple lung nodules. He underwent biopsy. He also has been hospitalized for postobstructive pneumonia. Patient underwent bronchoscopy recently. Biopsy favor squamous cell carcinoma. Patient has been seen by medical oncology as well as pulmonary specialist. It has been deemed medically appropriate for the patient to consider a more palliative/comfort focused more of care.  Patient is awake alert resting in bed. He is able to answer a few questions appropriately. Specifically, he denies pain he denies shortness of breath. He is not able to fully recall all the conversations he has had a with medical oncology as well as pulmonary care. His daughter Corey Dorsey is present at the bedside. I introduced myself and palliative care as follows: Palliative medicine is specialized medical care for people living with serious illness. It focuses on providing relief from the symptoms and stress of a serious illness. The goal is to improve quality of life for both the patient and the family.  Corey Dorsey states that the patient unfortunately has had an unhealthy lifestyle and has made poor choices for decades now. He has had ongoing cigarette smoking, ongoing ETOH use. She states that he did not have a good home  environment with the nieces with home he was living.  Corey Dorsey is appreciative of all the information she has received from specialists. Additionally, she is appreciative of the information she has received from hospital medicine staff. She has been informed about the patient having diastolic dysfunction, hemoptysis, multiple lung nodules with biopsy favoring squamous cell, additionally she also knows about head CT showing evidence of metastatic disease.  We discussed about CODE STATUS we discussed about goals wishes in values. Primarily, a more of care that focuses exclusively on comfort, voiding suffering isn't deemed to be the most appropriate.  Please note additional discussions/recommendations below. Thank you for the consult.  NEXT OF KIN  daughter Corey Dorsey, she lives in [place], [place].   SUMMARY OF RECOMMENDATIONS   Code status is now DNR DNI Discussed about hospice philosophy of care CSW consult to help facilitate residential hospice  Thank you for the consult.   Code Status/Advance Care Planning:  DNR    Symptom Management:    continue current mode of care.   Palliative Prophylaxis:   Delirium Protocol   Psycho-social/Spiritual:   Desire for further Chaplaincy support:yes  Additional Recommendations: Education on Hospice  Prognosis:   Some very limited number of weeks.  Discharge Planning: Hospice facility      Primary Diagnoses: Present on Admission: . Lung mass . Elevated troponin . Microcytic hypochromic anemia . Tobacco use disorder   I have reviewed the medical record, interviewed the patient and family, and examined the patient. The following aspects are pertinent.  Past Medical History:  Diagnosis Date  . Alcohol abuse   . Hearing loss   . Tobacco abuse    Social History   Socioeconomic History  . Marital status: Single    Spouse name: Not on file  . Number of children: 2  . Years of education: 59  . Highest education level:  Not on file  Occupational History  . Occupation: Retired  Scientific laboratory technician  . Financial resource strain: Not on file  . Food insecurity:    Worry: Not on file    Inability: Not on file  . Transportation needs:    Medical: Not on file    Non-medical: Not on file  Tobacco Use  . Smoking status: Current Some Day Smoker    Packs/day: 0.15    Years: 58.00    Pack years: 8.70    Types: Cigarettes  . Smokeless tobacco: Never Used  Substance and Sexual Activity  . Alcohol use: Yes    Alcohol/week: 1.2 oz    Types: 2 Cans of beer per week  . Drug use: No  . Sexual activity: Not on file  Lifestyle  . Physical activity:    Days per week: Not on file    Minutes per session: Not on file  . Stress: Not on file  Relationships  . Social connections:    Talks on phone: Not on file    Gets together: Not on file    Attends religious service: Not on file    Active member of club or organization: Not on file    Attends meetings of clubs or organizations: Not on file    Relationship status: Not on file  Other Topics Concern  . Not on file  Social History Narrative   Fun: Walks regularly, likes sports, fishing   Family History  Problem Relation Age of Onset  . Tuberculosis Father    Scheduled Meds: . sodium chloride   Intravenous Once  . acetaminophen  650 mg Oral Once  . diphenhydrAMINE  25 mg Oral Once  . ferrous sulfate  325 mg Oral Q breakfast  . levETIRAcetam  500 mg Oral BID  . lidocaine  1 patch Transdermal Q24H   Continuous Infusions: . ceFEPime (MAXIPIME) IV Stopped (09/14/17 1016)   PRN Meds:.acetaminophen **OR** acetaminophen, ondansetron **OR** ondansetron (ZOFRAN) IV, oxyCODONE, zolpidem Medications Prior to Admission:  Prior to Admission medications   Medication Sig Start Date End Date Taking? Authorizing Provider  acetaminophen (TYLENOL) 500 MG tablet Take 500 mg by mouth daily as needed for mild pain.   Yes [provider]  naproxen sodium (ALEVE) 220 MG  tablet Take 220 mg by mouth daily as needed (pain).   Yes [provider]   No Known Allergies Review of Systems Denies dyspnea Denies pain  Physical Exam Frail elderly gentleman resting in bed Thin, cachectic Scattered rhonchi Muscle wasting evident No edema S1-S2  Vital Signs: BP 110/69 (BP Location: Left Arm)   Pulse 96   Temp 98.2 F (36.8 C) (Oral)   Resp 20   Ht 5\' 8"  (1.727 m)   Wt 57.8 kg (127 lb 6.8 oz)   SpO2 96%   BMI 19.37 kg/m  Pain  Scale: 0-10 POSS *See Group Information*: 1-Acceptable,Awake and alert Pain Score: Asleep   SpO2: SpO2: 96 % O2 Device:SpO2: 96 % O2 Flow Rate: .O2 Flow Rate (L/min): 3 L/min  IO: Intake/output summary:   Intake/Output Summary (Last 24 hours) at 09/14/2017 1249 Last data filed at 09/14/2017 0100 Gross per 24 hour  Intake 240 ml  Output 925 ml  Net -685 ml    LBM: Last BM Date: 09/09/17 Baseline Weight: Weight: 56.5 kg (124 lb 9 oz) Most recent weight: Weight: 57.8 kg (127 lb 6.8 oz)     Palliative Assessment/Data:   Flowsheet Rows     Most Recent Value  Intake Tab  Referral Department  Hospitalist  Unit at Time of Referral  -- [TELEMENTRY/UROLOGY]  Palliative Care Primary Diagnosis  Pulmonary  Date Notified  09/13/17  Palliative Care Type  New Palliative care  Reason for referral  Clarify Goals of Care  Date of Admission  09/05/17  Date first seen by Palliative Care  09/13/17  # of days Palliative referral response time  0 Day(s)  # of days IP prior to Palliative referral  8  Clinical Assessment  Psychosocial & Spiritual Assessment  Palliative Care Outcomes    PPS 30%  Time In:  12 Time Out:  1300 Time Total:  60 min  Greater than 50%  of this time was spent counseling and coordinating care related to the above assessment and plan.  Signed by: Loistine Chance, MD  [phone]  Please contact Palliative Medicine Team phone at [phone] for questions and concerns.  For individual provider: See  Shea Evans

## 2017-09-14 NOTE — Progress Notes (Signed)
Patient with hemoptysis and recent lung mass biopsy in IR 09/07/2017 with Dr. Kathlene Cote.  IR consulted for evaluation and management due to recent drop in hemoglobin.  Patient underwent bronchoscopy 09/12/2017. Patient was then seen by Dr. Julien Nordmann who recommended palliative radiotherapy to decrease hemoptysis.  Due to indication for palliative radiotherapy, no IR procedure will be preformed. IR available if needed in future.  Bea Graff Louk, PA-C 09/14/2017, 3:46 PM

## 2017-09-15 ENCOUNTER — Ambulatory Visit
Admit: 2017-09-15 | Discharge: 2017-09-15 | Disposition: A | Discharge status: Home / Self Care | Payer: Medicare Other | Attending: Radiation Oncology | Admitting: Radiation Oncology

## 2017-09-15 ENCOUNTER — Inpatient Hospital Stay (HOSPITAL_COMMUNITY): Payer: Medicare Other

## 2017-09-15 DIAGNOSIS — C3412 Malignant neoplasm of upper lobe, left bronchus or lung: Secondary | ICD-10-CM

## 2017-09-15 DIAGNOSIS — C3490 Malignant neoplasm of unspecified part of unspecified bronchus or lung: Secondary | ICD-10-CM

## 2017-09-15 DIAGNOSIS — R0902 Hypoxemia: Secondary | ICD-10-CM

## 2017-09-15 DIAGNOSIS — F17200 Nicotine dependence, unspecified, uncomplicated: Secondary | ICD-10-CM

## 2017-09-15 LAB — CBC
HEMATOCRIT: 26.2 % — AB (ref 39.0–52.0)
HEMOGLOBIN: 8.2 g/dL — AB (ref 13.0–17.0)
MCH: 25.6 pg — AB (ref 26.0–34.0)
MCHC: 31.3 g/dL (ref 30.0–36.0)
MCV: 81.9 fL (ref 78.0–100.0)
Platelets: 306 10*3/uL (ref 150–400)
RBC: 3.2 MIL/uL — AB (ref 4.22–5.81)
RDW: 16.7 % — ABNORMAL HIGH (ref 11.5–15.5)
WBC: 9.3 10*3/uL (ref 4.0–10.5)

## 2017-09-15 LAB — BPAM RBC
BLOOD PRODUCT EXPIRATION DATE: 201908242359
ISSUE DATE / TIME: 201907251356
Unit Type and Rh: 5100

## 2017-09-15 LAB — BASIC METABOLIC PANEL
ANION GAP: 5 (ref 5–15)
BUN: 16 mg/dL (ref 8–23)
CHLORIDE: 106 mmol/L (ref 98–111)
CO2: 25 mmol/L (ref 22–32)
Calcium: 8.5 mg/dL — ABNORMAL LOW (ref 8.9–10.3)
Creatinine, Ser: 1.06 mg/dL (ref 0.61–1.24)
GFR calc Af Amer: >60 mL/min (ref >60)
GFR calc non Af Amer: >60 mL/min (ref >60)
GLUCOSE: 76 mg/dL (ref 70–99)
POTASSIUM: 4.6 mmol/L (ref 3.5–5.1)
Sodium: 136 mmol/L (ref 135–145)

## 2017-09-15 LAB — TYPE AND SCREEN
ABO/RH(D): O POS
Antibody Screen: NEGATIVE
Unit division: 0

## 2017-09-15 MED ORDER — GUAIFENESIN-DM 100-10 MG/5ML PO SYRP
5.0000 mL | ORAL_SOLUTION | ORAL | Status: DC | PRN
  Administered 2017-09-15 – 2017-09-16 (×3): 5 mL via ORAL
  Filled 2017-09-15 (×3): qty 10

## 2017-09-15 MED ORDER — SODIUM CHLORIDE 0.9 % IV SOLN
2.0000 g | Freq: Two times a day (BID) | INTRAVENOUS | Status: AC
  Administered 2017-09-15 – 2017-09-16 (×2): 2 g via INTRAVENOUS
  Filled 2017-09-15 (×2): qty 2

## 2017-09-15 NOTE — Progress Notes (Signed)
CSW consulted for residential hospice placement.  CSW spoke with patient's daughter regarding consult for residential hospice. Patient's daughter confirmed interest in residential hospice specifically [facility]. CSW explained the process and agreed to make the referral.  CSW made referral to [place] [facility]. Hospital liaison agreed to follow up with patient/patient's daughter.  CSW spoke with patient's attending MD who reported patient is scheduled for palliative radiation today and Monday.  CSW updated [facility] liaison, she reported that she is aware and agreed to follow up with patient on Monday to re-evaluate for [facility].   CSW will continue to follow and assist with discharge planning.  Abundio Miu, [place] Social Worker [facility] Cell#: [phone]

## 2017-09-15 NOTE — Progress Notes (Signed)
PROGRESS NOTE    KETHAN PAPADOPOULOS  QVZ:[id number] DOB: [birth year] DOA: 09/05/2017 PCP: Golden Circle, FNP    Brief Narrative:  77 year old with no medical history admitted after experiencing anterior chest pain and pleuritic chest pain and found to have elevated troponin lung mass with multiple metastases who is status post biopsy on 09/07/2017.      Assessment & Plan:   Principal Problem:   Non-small cell carcinoma of lung (HCC) Active Problems:   Tobacco use disorder   Lung mass   Shortness of breath   Elevated troponin   Microcytic hypochromic anemia   Hemoptysis  1 fever secondary to postobstructive pneumonia Patient likely with postobstructive pneumonia as CT chest with lung mass and pulmonary nodules concerning for mets.  Patient had repeat CT angiogram done 09/10/2017 showing worsening opacities.  Patient noted to have a temperature up to 101, on 09/10/2017.  Patient placed on IV cefepime which we will continue through today to complete a one-week course of treatment.  Blood cultures pending.  Continue scheduled nebulizers. Follow.  2.  Hemoptysis Secondary to non-small cell carcinoma noted on bronchial washings.  Patient noted to have small volumes of hemoptysis.  Status post transfusion of 1 unit packed red blood cells 09/10/2017. Hemoglobin currently at 7.5.  It is noted that patient's hemoptysis has been occurring for several weeks prior to biopsy which was done.  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 IR was consulted and recommended discussion with pulmonary about localizing hemoptysis prior to consideration of called embolization if patient were to have significant worsening hemoptysis.   Patient seen again by pulmonary on 09/12/2017 and patient subsequently underwent bronchoscopy with brushings taken which are consistent with non-small cell carcinoma..   Per pulmonary bronchoscopy showed bleeding from anterior segment of the left upper lobe.  Per pulmonary if patient develops massive hemoptysis could target this area for IR embolization.  Radiation oncology consulted for palliative radiation secondary to hemoptysis.   3.  Lung mass status post biopsy 09/07/2017/non-small cell carcinoma.  Concerning for bronchogenic carcinoma with diffuse mets.  Patient with ongoing hemoptysis.  Patient underwent bronchoscopy 09/12/2017 with brushings taken and results pending.  Pulmonary following.  Biopsies done by interventional radiology with pathology with necrosis with focal poorly differentiated carcinoma.  Bronchial washings consistent with non-small cell carcinoma.  Patient with metastatic disease.  CT head consistent with mets.  Patient seen by oncology and feel patient is a poor candidate for any chemotherapy and recommending palliative care and palliative radiation for hemoptysis.  Continue Keppra for seizure prophylaxis.  Radiation oncology consulted for palliative radiation for hemoptysis.  Palliative care following, pulmonary following, radiation oncology following.    4.  Hypoxia Was called by RN as patient noted to be hypoxic with sats in the 70s to 80s despite 6 L nasal cannula on 09/13/2017.  Patient noted to be hypoxic the night of 09/12/2017, with sats of 79% and patient noted to not have nasal cannula incorrectly.  Nasal cannula placed and patient noted to have sats in the 80s and O2 had to be increased to 5 L with sats of 92% overnight.  On evaluation of patient on 09/13/2017 patient with no significant distress.  O2 sats was previous as patient noted to have clubbing and pulse ox was moved to the earlobe, O2 sats immediately improved to 100%.  Wean O2.  Continue empiric IV antibiotics for probable postobstructive pneumonia.  Continue scheduled nebs.    5.  Elevated troponin 2D echo with normal EF and grade 1 diastolic dysfunction.  CT chest did show  significant calcific disease.  Patient seen in consultation by cardiology who feel patient is not a very good candidate for intervention at this time particularly with his hemoptysis and possibly limited life expectancy.  Follow.  6.  Iron deficiency anemia/anemia of chronic disease/acute blood loss anemia Status post 1 unit packed red blood cells 09/10/2017.  Hemoglobin currently stable at 8.2 from 7.5 after transfusion of another unit of packed red blood cells.  Status post IV Feraheme.  Follow H&H with hemoptysis.     DVT prophylaxis: SCDs Code Status: Full Family Communication updated patient.  No family at bedside. Disposition Plan: Likely to residential hospice once patient has finished palliative radiation.   Consultants:   PCCM Dr. Lynetta Mare 09/06/2017  Cardiology Dr. Harrington Challenger 09/06/2017  Interventional radiology  Palliative care: Dr. Rowe Pavy 09/14/2017  Oncology: Dr. Lorna Few 09/13/2017  Procedures:   CT angiogram chest 09/10/2017, 09/05/2017  CT left shoulder 09/07/2017  CT-guided core biopsy left lung mass per IR Dr. Kathlene Cote 09/07/2017  Chest x-ray 09/05/2017, 09/10/2017  2D echo 09/06/2017  Bronchoscopy per Dr. Lake Bells 09/12/2017  Transfuse 1 unit packed red blood cells 09/10/2017.  Transfuse 1 unit packed red blood cells 09/14/2017  CT head with and without contrast 09/13/2017.  Antimicrobials:   IV cefepime 09/11/2017>>>>>>> 09/15/2017     Subjective: Patient in bed.  Still with hemoptysis.  Denies any chest pain.  No shortness of breath.   Objective: Vitals:   09/14/17 2100 09/15/17 0450 09/15/17 1430 09/15/17 1507  BP:  106/60 98/72 101/65  Pulse:  91 93 90  Resp:  20 20   Temp:  99.6 F (37.6 C) 99.2 F (37.3 C)   TempSrc:  Oral Oral   SpO2: 97% 92% 92%   Weight:  55.1 kg (121 lb 7.6 oz)    Height:        Intake/Output Summary (Last 24 hours) at 09/15/2017 1522 Last data filed at 09/15/2017 1438 Gross per 24 hour  Intake 795 ml  Output 600 ml    Net 195 ml   Filed Weights   09/12/17 1357 09/13/17 0640 09/15/17 0450  Weight: 57.6 kg (127 lb) 57.8 kg (127 lb 6.8 oz) 55.1 kg (121 lb 7.6 oz)    Examination:  General exam: Frail.  Cachectic.  Temporal wasting. Respiratory system: Lungs clear to auscultation bilaterally anterior lung fields.  No wheezing.  No rhonchi.  Normal respiratory effort.   Cardiovascular system: RRR no murmurs rubs or gallops.  No lower extremity edema.  No JVD.  Gastrointestinal system: Abdomen is soft, nontender, nondistended, positive bowel sounds.  No rebound.  No guarding.     Central nervous system: Alert and oriented. No focal neurological deficits. Extremities: Symmetric 5 x 5 power. Skin: No rashes, lesions or ulcers Psychiatry: Judgement and insight appear poor to fair. Mood & affect appropriate.     Data Reviewed: I have personally reviewed following labs and imaging studies  CBC: Recent Labs  Lab 09/11/17 0509 09/12/17 0520 09/13/17 0528 09/13/17 1426 09/14/17 0521 09/14/17 1828 09/15/17 0458  WBC 8.4 7.6 8.3  --  8.2  --  9.3  NEUTROABS  --   --   --   --  6.6  --   --   HGB 8.2* 8.5* 7.5* 8.3* 7.5* 8.5* 8.2*  HCT 26.3* 26.9* 24.4* 27.3* 24.3* 27.3* 26.2*  MCV  79.2 80.1 80.0  --  80.5  --  81.9  PLT 293 307 315  --  298  --  175   Basic Metabolic Panel: Recent Labs  Lab 09/10/17 0541 09/12/17 0520 09/13/17 0528 09/14/17 0521 09/15/17 0458  NA 134* 133* 134* 134* 136  K 4.4 4.8 4.6 4.4 4.6  CL 101 102 103 103 106  CO2 27 24 24 23 25   GLUCOSE 86 84 85 109* 76  BUN 17 16 19 17 16   CREATININE 1.05 0.99 1.07 1.02 1.06  CALCIUM 8.5* 8.4* 8.2* 8.3* 8.5*  MG 2.3  --   --  2.1  --    GFR: Estimated Creatinine Clearance: 44.8 mL/min (by C-G formula based on SCr of 1.06 mg/dL). Liver Function Tests: No results for input(s): AST, ALT, ALKPHOS, BILITOT, PROT, ALBUMIN in the last 168 hours. No results for input(s): LIPASE, AMYLASE in the last 168 hours. No results for  input(s): AMMONIA in the last 168 hours. Coagulation Profile: No results for input(s): INR, PROTIME in the last 168 hours. Cardiac Enzymes: No results for input(s): CKTOTAL, CKMB, CKMBINDEX, TROPONINI in the last 168 hours. BNP (last 3 results) No results for input(s): PROBNP in the last 8760 hours. HbA1C: No results for input(s): HGBA1C in the last 72 hours. CBG: No results for input(s): GLUCAP in the last 168 hours. Lipid Profile: No results for input(s): CHOL, HDL, LDLCALC, TRIG, CHOLHDL, LDLDIRECT in the last 72 hours. Thyroid Function Tests: No results for input(s): TSH, T4TOTAL, FREET4, T3FREE, THYROIDAB in the last 72 hours. Anemia Panel: No results for input(s): VITAMINB12, FOLATE, FERRITIN, TIBC, IRON, RETICCTPCT in the last 72 hours. Sepsis Labs: Recent Labs  Lab 09/10/17 1707 09/11/17 0509 09/12/17 0520  PROCALCITON 0.18 0.20 0.15    Recent Results (from the past 240 hour(s))  Culture, blood (routine x 2)     Status: None (Preliminary result)   Collection Time: 09/11/17  8:38 AM  Result Value Ref Range Status   Specimen Description   Final    BLOOD LEFT ANTECUBITAL Performed at [facility], [place] [address] [place], [place] [address]    Special Requests   Final    BOTTLES DRAWN AEROBIC AND ANAEROBIC Blood Culture results may not be optimal due to an excessive volume of blood received in culture bottles Performed at [facility] [address], [place], [place] [address]    Culture   Final    NO GROWTH 4 DAYS Performed at [facility], [place] [address] [place], [place] [address]    Report Status PENDING  Incomplete  Culture, blood (routine x 2)     Status: None (Preliminary result)   Collection Time: 09/11/17  8:48 AM  Result Value Ref Range Status   Specimen Description   Final    BLOOD RIGHT HAND Performed at [facility] [address] [place], [place] [address]    Special Requests   Final    BOTTLES  DRAWN AEROBIC AND ANAEROBIC Blood Culture adequate volume Performed at [facility] [address], [place], [place] [address]    Culture   Final    NO GROWTH 4 DAYS Performed at [facility], [place] [address] [place], [place] [address]    Report Status PENDING  Incomplete         Radiology Studies: Ct Head W & Wo Contrast  Result Date: 09/13/2017 CLINICAL DATA:  Lung cancer staging. EXAM: CT HEAD WITHOUT  AND WITH CONTRAST TECHNIQUE: Contiguous axial images were obtained from the base of the skull through the vertex without and with intravenous contrast CONTRAST:  161mL ISOVUE-300 IOPAMIDOL (ISOVUE-300) INJECTION 61% COMPARISON:  MRI of the head November 17, 2011 FINDINGS: BRAIN: 14 x 16 mm RIGHT paramedian parietal mass with intrinsic rim density and superimposed marginal enhancement. Surrounding low-density vasogenic edema. 4 mm enhancing nodule LEFT frontal gray-white matter junction (series 6, image 17). No midline shift or mass effect. No acute large vascular territory infarct. No parenchymal brain volume loss for age. No hydrocephalus. Patchy supratentorial white matter hypodensities compatible with mild chronic small vessel ischemic changes, less than expected for age. VASCULAR: Moderate calcific atherosclerosis of the carotid siphons. SKULL: No skull fracture. No significant scalp soft tissue swelling. SINUSES/ORBITS: Chronic LEFT maxillary sinusitis. Minimal RIGHT mastoid effusion.The included ocular globes and orbital contents are non-suspicious. OTHER: None. IMPRESSION: 1. 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 MRI of the brain with contrast may demonstrate additional metastasis. 2. No acute intracranial process. Electronically Signed   By: Elon Alas M.D.   On: 09/13/2017 19:45   Dg Chest Port 1 View  Result Date: 09/15/2017 CLINICAL DATA:  Follow-up pneumonia EXAM: PORTABLE CHEST 1 VIEW COMPARISON:  09/10/2017 FINDINGS: Cardiac shadow  is stable. Persistent bilateral infiltrates are identified left greater than right. Given some patient positioning differences the overall appearance is stable. No bony abnormality is seen. IMPRESSION: Patchy bilateral infiltrates left greater than right. Electronically Signed   By: Inez Catalina M.D.   On: 09/15/2017 07:01        Scheduled Meds: . ferrous sulfate  325 mg Oral Q breakfast  . levETIRAcetam  500 mg Oral BID  . lidocaine  1 patch Transdermal Q24H   Continuous Infusions: . ceFEPime (MAXIPIME) IV       LOS: 10 days    Time spent: 35 minutes    Irine Seal, MD [facility] Pager [phone]  If 7PM-7AM, please contact night-coverage [email or web address] Password [facility] 09/15/2017, 3:22 PM

## 2017-09-15 NOTE — Consult Note (Addendum)
Radiation Oncology         [phone]) [phone] ________________________________  Initial inpatient Consultation  Name: Corey Dorsey MRN: [id number]  Date: 09/05/2017  DOB: 08-25-1940  PJ:KDTOIZ, Ples Specter, FNP  No ref. provider found   REFERRING PHYSICIAN: Irine Seal MD  DIAGNOSIS:    ICD-10-CM   1. Lung neoplasm D49.1   2. Shortness of breath R06.02   3. Hypoxia R09.02 CANCELED: DG CHEST PORT 1 VIEW    CANCELED: DG CHEST PORT 1 VIEW  4. Lung mass R91.8 CT BIOPSY    CT BIOPSY  5. Hemoptysis R04.2 DG CHEST PORT 1 VIEW    DG CHEST PORT 1 VIEW    CANCELED: IR Radiologist Eval & Mgmt    CANCELED: IR Radiologist Eval & Mgmt  6. Pneumonia J18.9 DG CHEST PORT 1 VIEW    DG CHEST PORT 1 VIEW    CHIEF COMPLAINT: Here to discuss management of lung cancer  HISTORY OF PRESENT ILLNESS::Corey Dorsey is a 77 y.o. male who presented with SOB and chest pain and hypoxia as well as hemoptysis.  Workup revealed diffuse metastatic disease within the lungs on CT angiogram and masses in the brain consistent with brain metastases (head CT). I reviewed these images myself.  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  Patient has a history of smoking.  He met with medical oncology and will not purse systemic therapy due to his performance status/comorbidities. He has seen palliative care and hospice is anticipated. His status is DNR DNI.    During encounter patient coughed up a couple tablespoons of sputum with more blood than mucous.  He has received blood transfusions as inpatient. Denies HA, nausea.  Reports coughing up blood repeatedly. He reports hunger pains, but no other pain.  He is able to eat breakfast.  PAST MEDICAL HISTORY:  has a past medical history of Alcohol abuse, Hearing loss, and Tobacco abuse.    PAST SURGICAL HISTORY: Past Surgical History:  Procedure Laterality Date  . ABDOMINAL SURGERY    . VIDEO  BRONCHOSCOPY Bilateral 09/12/2017   Procedure: VIDEO BRONCHOSCOPY WITHOUT FLUORO;  Surgeon: Juanito Doom, MD;  Location: [facility];  Service: Cardiopulmonary;  Laterality: Bilateral;    FAMILY HISTORY: family history includes Tuberculosis in his father.  SOCIAL HISTORY:  reports that he has been smoking cigarettes.  He has a 8.70 pack-year smoking history. He has never used smokeless tobacco. He reports that he drinks about 1.2 oz of alcohol per week. He reports that he does not use drugs.  ALLERGIES: Patient has no known allergies.  MEDICATIONS:  Current Facility-Administered Medications  Medication Dose Route Frequency Provider Last Rate Last Dose  . acetaminophen (TYLENOL) tablet 650 mg  650 mg Oral Q6H PRN Rise Patience, MD   650 mg at 09/13/17 1245   Or  . acetaminophen (TYLENOL) suppository 650 mg  650 mg Rectal Q6H PRN Rise Patience, MD      . ceFEPIme (MAXIPIME) 2 g in sodium chloride 0.9 % 100 mL IVPB  2 g Intravenous BID Cristy Folks, MD   Stopped at 09/14/17 2205  . ferrous sulfate tablet 325 mg  325 mg Oral Q breakfast Purohit, Shrey C, MD   325 mg at 09/15/17 0745  . levETIRAcetam (KEPPRA) tablet 500 mg  500 mg Oral BID Eugenie Filler, MD   500 mg at 09/14/17 2135  . lidocaine (LIDODERM) 5 % 1 patch  1 patch Transdermal  Q24H Purohit, Konrad Dolores, MD   1 patch at 09/14/17 1014  . ondansetron (ZOFRAN) tablet 4 mg  4 mg Oral Q6H PRN Rise Patience, MD       Or  . ondansetron [facility]) injection 4 mg  4 mg Intravenous Q6H PRN Rise Patience, MD      . oxyCODONE (Oxy IR/ROXICODONE) immediate release tablet 5 mg  5 mg Oral Q4H PRN Reyne Dumas, MD   5 mg at 09/15/17 0746  . zolpidem (AMBIEN) tablet 5 mg  5 mg Oral QHS PRN Gardiner Barefoot, NP   5 mg at 09/11/17 2301    REVIEW OF SYSTEMS:  Notable for that above.   PHYSICAL EXAM:  height is 5' 8" (1.727 m) and weight is 121 lb 7.6 oz (55.1 kg). His oral temperature is 99.6 F (37.6 C).  His blood pressure is 106/60 and his pulse is 91. His respiration is 20 and oxygen saturation is 92%.   Chest - crackles at bases Heart RRR; no murmurs No edema in ankles Cannot name the president, oriented to place and self, states the date after looking at the calendar on the wall.  ECOG = 3  0 - Asymptomatic (Fully active, able to carry on all predisease activities without restriction)  1 - Symptomatic but completely ambulatory (Restricted in physically strenuous activity but ambulatory and able to carry out work of a light or sedentary nature. For example, light housework, office work)  2 - Symptomatic, <50% in bed during the day (Ambulatory and capable of all self care but unable to carry out any work activities. Up and about more than 50% of waking hours)  3 - Symptomatic, >50% in bed, but not bedbound (Capable of only limited self-care, confined to bed or chair 50% or more of waking hours)  4 - Bedbound (Completely disabled. Cannot carry on any self-care. Totally confined to bed or chair)  5 - Death   Eustace Pen MM, Creech RH, Tormey DC, et al. [phone]). "Toxicity and response criteria of the [facility]". [place] Oncol. 5 (6): 649-55   LABORATORY DATA:  Lab Results  Component Value Date   WBC 9.3 09/15/2017   HGB 8.2 (L) 09/15/2017   HCT 26.2 (L) 09/15/2017   MCV 81.9 09/15/2017   PLT 306 09/15/2017   CMP     Component Value Date/Time   NA 136 09/15/2017 0458   K 4.6 09/15/2017 0458   CL 106 09/15/2017 0458   CO2 25 09/15/2017 0458   GLUCOSE 76 09/15/2017 0458   BUN 16 09/15/2017 0458   CREATININE 1.06 09/15/2017 0458   CREATININE 0.98 04/19/2011 1546   CALCIUM 8.5 (L) 09/15/2017 0458   PROT 7.9 09/05/2017 1429   ALBUMIN 2.6 (L) 09/05/2017 1429   AST 23 09/05/2017 1429   ALT 10 09/05/2017 1429   ALKPHOS 48 09/05/2017 1429   BILITOT 0.7 09/05/2017 1429   GFRNONAA >60 09/15/2017 0458   GFRAA >60 09/15/2017 0458         RADIOGRAPHY: Dg  Chest 2 View  Result Date: 09/05/2017 CLINICAL DATA:  Hemoptysis.  History of smoking. EXAM: CHEST - 2 VIEW COMPARISON:  Prior report 11/09/1998. FINDINGS: Multiple bilateral pulmonary mass lesions and infiltrates are present. Findings are suspicious for metastatic disease with possible superimposed pneumonia. Small bilateral pleural effusions, left side greater than right. Elevation left hemidiaphragm. No pneumothorax. Heart size normal. No acute bony abnormality. IMPRESSION: Multiple bilateral pulmonary mass lesions infiltrates are present. Findings  suspicious for metastatic disease with possible superimposed pneumonia. Small bilateral pleural effusions. Contrast-enhanced chest CT should be considered for further evaluation. Electronically Signed   By: Marcello Moores  Register   On: 09/05/2017 15:03   Ct Head W & Wo Contrast  Result Date: 09/13/2017 CLINICAL DATA:  Lung cancer staging. EXAM: CT HEAD WITHOUT AND WITH CONTRAST TECHNIQUE: Contiguous axial images were obtained from the base of the skull through the vertex without and with intravenous contrast CONTRAST:  128m ISOVUE-300 IOPAMIDOL (ISOVUE-300) INJECTION 61% COMPARISON:  MRI of the head November 17, 2011 FINDINGS: BRAIN: 14 x 16 mm RIGHT paramedian parietal mass with intrinsic rim density and superimposed marginal enhancement. Surrounding low-density vasogenic edema. 4 mm enhancing nodule LEFT frontal gray-white matter junction (series 6, image 17). No midline shift or mass effect. No acute large vascular territory infarct. No parenchymal brain volume loss for age. No hydrocephalus. Patchy supratentorial white matter hypodensities compatible with mild chronic small vessel ischemic changes, less than expected for age. VASCULAR: Moderate calcific atherosclerosis of the carotid siphons. SKULL: No skull fracture. No significant scalp soft tissue swelling. SINUSES/ORBITS: Chronic LEFT maxillary sinusitis. Minimal RIGHT mastoid effusion.The included ocular  globes and orbital contents are non-suspicious. OTHER: None. IMPRESSION: 1. 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 MRI of the brain with contrast may demonstrate additional metastasis. 2. No acute intracranial process. Electronically Signed   By: CElon AlasM.D.   On: 09/13/2017 19:45   Ct Angio Chest Pe W Or Wo Contrast  Result Date: 09/10/2017 CLINICAL DATA:  Hemoptysis.  Multiple pulmonary tumors. EXAM: CT ANGIOGRAPHY CHEST WITH CONTRAST TECHNIQUE: Multidetector CT imaging of the chest was performed using the standard protocol during bolus administration of intravenous contrast. Multiplanar CT image reconstructions and MIPs were obtained to evaluate the vascular anatomy. CONTRAST:  1013mISOVUE-370 IOPAMIDOL (ISOVUE-370) INJECTION 76% COMPARISON:  CT-guided biopsy 09/07/2017 Chest CTA 09/05/2017 FINDINGS: Cardiovascular: --Pulmonary arteries: Contrast injection is sufficient to demonstrate satisfactory opacification of the pulmonary arteries to the segmental level. There is no pulmonary embolus. The main pulmonary artery is within normal limits for size. --Aorta: Satisfactory opacification of the thoracic aorta. No aortic dissection or other acute aortic syndrome. Conventional 3 vessel aortic branching pattern. The aortic course and caliber are normal. There is mild aortic atherosclerosis. --Heart: Normal size. No pericardial effusion. Mediastinum/Nodes: Level 4R nodes measure up to 12 mm. Level 6 nodes measure up to 10 mm. Visualized thyroid is normal. Normal course of the esophagus. No axillary lymphadenopathy. Lungs/Pleura: The dominant mass within the left upper lobe measures 10.7 x 8.4 cm, unchanged in size. The mass encases multiple pulmonary arterial branches. There are numerous other pulmonary nodules scattered throughout both lungs. The largest nodule in the right measures 2.9 cm. Small left pleural effusion. Multiple areas of reticular opacity within both lungs has worsened,  most notably in the right upper and middle lobes and the left upper lobe. The lingual bronchus is severely narrowed by the dominant mass, but unchanged from the prior study. Upper Abdomen: Contrast bolus timing is not optimized for evaluation of the abdominal organs. Within this limitation, the visualized organs of the upper abdomen are normal. Musculoskeletal: No chest wall abnormality. No acute or significant osseous findings. Review of the MIP images confirms the above findings. IMPRESSION: 1. No pulmonary embolus. 2. Unchanged size of dominant left upper lobe mass measuring up to 10.7 cm, narrowing the lingular bronchus and encasing multiple small pulmonary artery branches. This is a probable source of hemoptysis. 3.  Numerous bilateral pulmonary metastatic nodules, which are also potential sources of hemoptysis. 4.  Aortic Atherosclerosis (ICD10-I70.0). Electronically Signed   By: Ulyses Jarred M.D.   On: 09/10/2017 17:04   Ct Angio Chest Pe W And/or Wo Contrast  Result Date: 09/05/2017 CLINICAL DATA:  Intermittent chest pain for 2 months. Hemoptysis. Decreased appetite. History of smoking. EXAM: CT ANGIOGRAPHY CHEST WITH CONTRAST TECHNIQUE: Multidetector CT imaging of the chest was performed using the standard protocol during bolus administration of intravenous contrast. Multiplanar CT image reconstructions and MIPs were obtained to evaluate the vascular anatomy. CONTRAST:  183m ISOVUE-370 IOPAMIDOL (ISOVUE-370) INJECTION 76% COMPARISON:  Chest radiograph 09/05/2017 FINDINGS: Cardiovascular: Pulmonary arterial opacification is adequate without evidence of emboli. Mild thoracic aortic atherosclerosis is noted without aneurysm. The heart is normal in size. Coronary artery atherosclerosis is noted. There is a trace pericardial effusion. Mediastinum/Nodes: Bilateral gynecomastia. Scattered subcentimeter mediastinal lymph nodes, the largest measuring 9 mm in short axis in the AP window. Enlarged hilar lymph  nodes measure up to 14 mm on the left. Lungs/Pleura: A large mass in the lingula measures 10.6 x 8.6 x 9.2 cm (AP x transverse x craniocaudal) and extends to both the lateral and mediastinal pleural surfaces with some adjacent pleural thickening. A trace left pleural effusion versus pleural thickening is noted posteriorly in the left lower hemithorax. There are innumerable pulmonary nodules throughout both lungs. An anterior right upper lobe mass extending to the minor fissure measures 3.9 x 2.6 cm. Moderate centrilobular and paraseptal emphysema is noted. Areas of ground-glass opacity and interlobular septal thickening are present bilaterally, greatest in the left upper lobe and left lower lobe. Upper Abdomen: Suspected stone in the gallbladder, incompletely imaged. Musculoskeletal: No suspicious lytic or blastic osseous lesion. Mild anterior wedging of the T12 vertebral body, most likely chronic. Review of the MIP images confirms the above findings. IMPRESSION: 1. No evidence of pulmonary emboli. 2. 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 Dominant 10 cm lingular mass may reflect primary bronchogenic carcinoma. 3. Mildly enlarged hilar lymph nodes, concerning for nodal metastases. 4. Aortic Atherosclerosis (ICD10-I70.0) and Emphysema (ICD10-J43.9). Electronically Signed   By: ALogan BoresM.D.   On: 09/05/2017 16:08   Ct Shoulder Left Wo Contrast  Result Date: 09/07/2017 CLINICAL DATA:  Left shoulder pain EXAM: CT OF THE UPPER LEFT EXTREMITY WITHOUT CONTRAST TECHNIQUE: Multidetector CT imaging of the upper left extremity was performed according to the standard protocol. COMPARISON:  09/05/2017 CT chest FINDINGS: Bones/Joint/Cartilage Flattening of the posterolateral humeral head which could be due to Hill-Sachs impaction. Degenerative glenohumeral spurring with some chronic fragmentation along the posterosuperior glenoid. Chondrocalcinosis of the glenoid labrum. Subacromial morphology is  type 2 (curved). Ligaments Suboptimally assessed by CT. Muscles and Tendons Chronic appearing atrophy of the supraspinatus and infraspinatus muscles favoring chronic rotator cuff ruptures. Soft tissues No obvious mass lesions along visualized segment of the brachial plexus. Extensive tumor burden the left chest. Severe emphysema. No obvious regional bony metastatic lesions; the ribs are blurred by motion artifact. Scattered airspace opacities especially in the left upper lobe. IMPRESSION: 1. 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 2. Flattening of the posterolateral humeral head which may be from remote Hill-Sachs impaction. 3. Extensive tumor burden in the left chest with severe emphysema. 4. Airspace opacity in the left upper lobe, potentially from pneumonia. 5. 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 Mild degenerative glenohumeral spurring. Electronically Signed   By: WThayer Jew  Janeece Fitting M.D.   On: 09/07/2017 18:25   Ct Biopsy  Result Date: 09/07/2017 CLINICAL DATA:  Large left upper lobe/lingular lung mass with multiple additional bilateral pulmonary masses. EXAM: CT GUIDED CORE BIOPSY OF LEFT LUNG MASS ANESTHESIA/SEDATION: 1.5 mg IV Versed; 50 mcg IV Fentanyl Total Moderate Sedation Time:  10 minutes. The patient's level of consciousness and physiologic status were continuously monitored during the procedure by Radiology nursing. PROCEDURE: The procedure risks, benefits, and alternatives were explained to the patient. Questions regarding the procedure were encouraged and answered. The patient understands and consents to the procedure. A time-out was performed prior to initiating the procedure. CT was performed through the chest in a supine position. The left anterior chest wall was prepped with chlorhexidine in a sterile fashion, and a sterile drape was applied covering the operative  field. A sterile gown and sterile gloves were used for the procedure. Local anesthesia was provided with 1% Lidocaine. A 17 gauge trocar needle was advanced into a left lung mass under CT guidance. Three separate coaxial 18 gauge core biopsy samples were obtained and submitted in formalin. Additional CT images were performed. COMPLICATIONS: None FINDINGS: Large left-sided lung mass centered in the lower left upper lobe and extending into the lingula measures approximately 10.8 cm in maximum diameter. Solid tissue was obtained. IMPRESSION: CT-guided biopsy performed of the large left-sided lung mass measuring nearly 11 cm in diameter. Electronically Signed   By: Aletta Edouard M.D.   On: 09/07/2017 17:22   Dg Chest Port 1 View  Result Date: 09/15/2017 CLINICAL DATA:  Follow-up pneumonia EXAM: PORTABLE CHEST 1 VIEW COMPARISON:  09/10/2017 FINDINGS: Cardiac shadow is stable. Persistent bilateral infiltrates are identified left greater than right. Given some patient positioning differences the overall appearance is stable. No bony abnormality is seen. IMPRESSION: Patchy bilateral infiltrates left greater than right. Electronically Signed   By: Inez Catalina M.D.   On: 09/15/2017 07:01   Dg Chest Port 1 View  Result Date: 09/10/2017 CLINICAL DATA:  Continued small amount of hemoptysis. Status post left lung mass biopsy on 09/07/2017. EXAM: PORTABLE CHEST 1 VIEW COMPARISON:  09/05/2017, chest CTA dated 09/05/2017 and CT guided left lung biopsy dated 09/07/2017. FINDINGS: The previously demonstrated large left lung mass appears larger and more confluent than on 09/05/2017. Multiple smaller masses are again demonstrated elsewhere in both lungs. Interval patchy densities in both lungs. This appears predominantly interstitial with some airspace component. No pneumothorax. Mild scoliosis. Diffuse osteopenia. Normal sized heart. IMPRESSION: 1. Interval increase in size and density of the previously demonstrated large  left lung mass. 2. Multiple smaller bilateral lung masses compatible with metastases are again demonstrated. 3. Interval patchy opacities in both lungs. Differential considerations include pneumonia, pneumonitis, pulmonary edema, lymphangitic spread of tumor and hemorrhage. Electronically Signed   By: Claudie Revering M.D.   On: 09/10/2017 09:15      IMPRESSION/PLAN: Today, I talked to the patient about the findings and work-up thus far. We discussed the patient's diagnosis of lung cancer and general treatment for this, highlighting the role of palliative radiotherapy in the management. We discussed the available radiation techniques, and focused on the details of logistics and delivery.   I recommend 2 fractions of radiotherapy to the airways to stop his hemoptysis.  I would recommend planning and giving a treatment today, and then after the weekend, this Monday. I do not recommend radiation for the brain masses and recommend comfort care only for his brain disease.  He has been placed  on Keppra.  Hospice care is anticipated after his hospitalization.   We discussed the risks, benefits, and side effects of radiotherapy. Side effects may include but not necessarily be limited to: esophagitis, fatigue, skin irritation, rare injury to internal organs. No guarantees of treatment were given. A consent form was signed and placed in the patient's medical record.  The patient was encouraged to ask questions that I answered to the best of my ability.  He is enthusiastic to proceed.  I left a phone message for his daughter, [name], to call me back so I may update her on her father's disposition.  I spent 45 minutes of floor time on this encounter, over 50% of which was face to face with the patient spent on counseling and coordination of care.   __________________________________________   Eppie Gibson, MD

## 2017-09-15 NOTE — Progress Notes (Signed)
Nutrition Brief Note  RD drawn to see pt due to underweight BMI.  Chart reviewed. Spoke with RN who reports patient now transitioning to comfort care.  No further nutrition interventions warranted at this time.  Please consult as needed.   Gaynell Face, MS, RD, LDN Pager: [phone] Weekend/After Hours: [phone]

## 2017-09-15 NOTE — Progress Notes (Signed)
Name: Corey Dorsey MRN: [id number] DOB: 1940/03/14    ADMISSION DATE:  09/05/2017 CONSULTATION DATE: 09/06/2017  CHIEF COMPLAINT: Abnormal CT chest/ hemoptysis  BRIEF PATIENT DESCRIPTION:  77 year old smoker with lung mass, multiple lung nodules status post biopsy Postobstructive pneumonia Biopsy results favors squamous cell cancer  SIGNIFICANT EVENTS  Status post bronchoscopy on 09/12/2017-significant for bleeding notable in the left upper lobe Biopsy favor squamous cell cancer, evidence of metastatic disease  STUDIES:  09/13/2017>> MRI brain>>1. 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 MRI of the brain with contrast may demonstrate additional metastasis.  09/10/2017>>CT scan of the chest >> dominant left upper lobe mass measuring up to 10.7 cm, narrowing the lingular bronchus and encasing multiple small pulmonary artery branches. This is a probable source of hemoptysis. Numerous bilateral pulmonary metastatic nodules, which are also potential sources of hemoptysis.  09/12/2017>>Bronch : The airway examination of the right lung was normal. - Blood was present in the anterior segment of the left upper lobe (B3). - A blood clot was found in the anterior segment of the left upper lobe (B3). - Brushings were obtained. - Bronchoalveolar lavage was performed. Findings: Left Lung Abnormalities: Fresh blood was found in the anterior segment of the left upper lobe (B3). The bleeding site was identified. A blot clot was found in the anterior segment of the left upper lobe (B3). It was partially obstructing the airway.   09/07/2017 Lung, needle/core biopsy(ies), left - NECROSIS WITH FOCAL POORLY DIFFERENTIATED CARCINOMA The majority of the tissue is necrotic. There is only of small focus of malignant cells.  The morphology slightly favors a squamous cell carcinoma. There is insufficient tissue for additional studies. 09/06/2017 Echo EF 59-74%, Grade 1 diastolic  dysfunction PAP 31 mm Hg RA moderately dilated Atrial septum: There was a probable, large patent foramen ovale. - Tricuspid valve: There was mild regurgitation.  HISTORY OF PRESENT ILLNESS:   77 year old with no medical history admitted after experiencing anterior chest pain and pleuritic chest pain and hemoptysis. He was found to have elevated troponin,  lung mass with multiple metastases who is status post biopsy on 09/07/2017. Pathology favors squamous cell carcinoma.  SUBJECTIVE:  Continued  hemoptysis post bronchoscopy 09/07/2017 with drop in HGB 7/25 States he continues to cough up bloody secretions Surgical pathology favors squamous cell carcinoma Remains on 3 L [place] with saturation of 94% Denies any acute complaints today, feeling relatively well   VITAL SIGNS: Temp:  [97.8 F (36.6 C)-99.6 F (37.6 C)] 99.6 F (37.6 C) (07/26 0450) Pulse Rate:  [76-91] 91 (07/26 0450) Resp:  [20-22] 20 (07/26 0450) BP: (99-110)/(60-77) 106/60 (07/26 0450) SpO2:  [88 %-97 %] 92 % (07/26 0450) Weight:  [121 lb 7.6 oz (55.1 kg)] 121 lb 7.6 oz (55.1 kg) (07/26 0450)  PHYSICAL EXAMINATION: General: Elderly gentleman, pleasant, does not appear to be in distress Neuro: No significant localization HEENT: No oral lesions, no nasal lesions Cardiovascular: Fenestra appreciated Lungs: Rhonchi, no use of accessory muscles  abdomen: Soft, bowel sounds appreciated Musculoskeletal: Moving all extremities, No obvious deformities Skin: No remarkable findings  Recent Labs  Lab 09/13/17 0528 09/14/17 0521 09/15/17 0458  NA 134* 134* 136  K 4.6 4.4 4.6  CL 103 103 106  CO2 _0 BUN _1 CREATININE 1.07 1.02 1.06  GLUCOSE 85 109* 76   Recent Labs  Lab 09/13/17 0528  09/14/17 0521 09/14/17 1828 09/15/17 0458  HGB 7.5*   < > 7.5*  8.5* 8.2*  HCT 24.4*   < > 24.3* 27.3* 26.2*  WBC 8.3  --  8.2  --  9.3  PLT 315  --  298  --  306   < > = values in this interval not displayed.   Ct  Head W & Wo Contrast  Result Date: 09/13/2017 CLINICAL DATA:  Lung cancer staging. EXAM: CT HEAD WITHOUT AND WITH CONTRAST TECHNIQUE: Contiguous axial images were obtained from the base of the skull through the vertex without and with intravenous contrast CONTRAST:  144m ISOVUE-300 IOPAMIDOL (ISOVUE-300) INJECTION 61% COMPARISON:  MRI of the head November 17, 2011 FINDINGS: BRAIN: 14 x 16 mm RIGHT paramedian parietal mass with intrinsic rim density and superimposed marginal enhancement. Surrounding low-density vasogenic edema. 4 mm enhancing nodule LEFT frontal gray-white matter junction (series 6, image 17). No midline shift or mass effect. No acute large vascular territory infarct. No parenchymal brain volume loss for age. No hydrocephalus. Patchy supratentorial white matter hypodensities compatible with mild chronic small vessel ischemic changes, less than expected for age. VASCULAR: Moderate calcific atherosclerosis of the carotid siphons. SKULL: No skull fracture. No significant scalp soft tissue swelling. SINUSES/ORBITS: Chronic LEFT maxillary sinusitis. Minimal RIGHT mastoid effusion.The included ocular globes and orbital contents are non-suspicious. OTHER: None. IMPRESSION: 1. 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 MRI of the brain with contrast may demonstrate additional metastasis. 2. No acute intracranial process. Electronically Signed   By: CElon AlasM.D.   On: 09/13/2017 19:45   Dg Chest Port 1 View  Result Date: 09/15/2017 CLINICAL DATA:  Follow-up pneumonia EXAM: PORTABLE CHEST 1 VIEW COMPARISON:  09/10/2017 FINDINGS: Cardiac shadow is stable. Persistent bilateral infiltrates are identified left greater than right. Given some patient positioning differences the overall appearance is stable. No bony abnormality is seen. IMPRESSION: Patchy bilateral infiltrates left greater than right. Electronically Signed   By: MInez CatalinaM.D.   On: 09/15/2017 07:01    ASSESSMENT /  PLAN:  Postobstructive pneumonia Status post bronchoscopy 7/18 Completed cefepime Cytology from bronchoscopy  favors squamous cell carcinoma Plan: Complete antibiotics Chest x-ray with patchy bilateral infiltrates-reviewed by myself-does not have any acute symptoms that is concerning for progressive infection  Hypoxemia>> Improving Plan: Titrate oxygen for sats 88-92% Pulmonary toilet as patient can tolerate   Hemoptysis/ Anemia Continues to cough up blood tinged secretions HGB drop 7/25 to 7.5 from 8.3 ( Does not appear to be hemodilutional Status post transfusion 7/21 Currently receiving iron dextran Plan: Trend CBC Transfuse per primary team  Will follow  Lung mass-biopsied 09/07/2017 :Pathology favors squamous cell carcinoma Seen by Dr. MEarlie Server7/24>> Does not feel candidate for systemic chemo >> recommends palliative care with palliative radiotherapy to largest mass to decrease hemoptysis. Palliative care consulted 7/24>> need to speak with daughter Plan: Agree with and Recommend Palliative care to align goals of care with Code status and plan of care per primary MD Agree with recommendation for palliative radiotherapy to decrease hemoptysis Evaluated by radiation oncology-treatments planned  Brain Mets per CT Head 7/24 Palliation treatment  Multiple lung nodules/masses Post biopsy-consistent with squamous cell  Diastolic dysfunction Per primary team  PCCM Will continue to follow with you for continued hemoptysis. Agree with palliative care goals  and palliative  radiotherapy to large mass to decrease hemoptysis.   Palliation discussions are ongoing with family.  ASherrilyn Rist MD  09/15/2017, 1:12 PM

## 2017-09-15 NOTE — Consult Note (Signed)
[place]:  Met with pt and his daughter. She reports that MD has offered some radiation to help stop bleeding and they would like to consider this. She stated it will be a treatment to day and Monday. We talked about hosp[facility] care at the [facility] and we will follow up with SW on Monday to see if we are still needed and can re-evaluate at that time for hospice home in [facility]. Thank you for letting us speak with the pt and family. If for some reason the pt declines over the weekend we will be happy to re-evalaute Doroteo Glassman RN is on call at [phone]. Webb Silversmith RN

## 2017-09-15 NOTE — Progress Notes (Signed)
PT Cancellation Note  Patient Details Name: Corey Dorsey MRN: [id number] DOB: 04/05/1940   Cancelled Treatment:    Reason Eval/Treat Not Completed:Noted palliative recs with plan for transfer to hospice facility. PT will sign off at this time.    Weston Anna, MPT Pager: [phone]

## 2017-09-16 LAB — CBC
HCT: 27.7 % — ABNORMAL LOW (ref 39.0–52.0)
Hemoglobin: 8.5 g/dL — ABNORMAL LOW (ref 13.0–17.0)
MCH: 25.1 pg — ABNORMAL LOW (ref 26.0–34.0)
MCHC: 30.7 g/dL (ref 30.0–36.0)
MCV: 81.7 fL (ref 78.0–100.0)
Platelets: 314 10*3/uL (ref 150–400)
RBC: 3.39 MIL/uL — ABNORMAL LOW (ref 4.22–5.81)
RDW: 17 % — ABNORMAL HIGH (ref 11.5–15.5)
WBC: 8.8 10*3/uL (ref 4.0–10.5)

## 2017-09-16 LAB — BASIC METABOLIC PANEL
Anion gap: 5 (ref 5–15)
BUN: 17 mg/dL (ref 8–23)
CHLORIDE: 103 mmol/L (ref 98–111)
CO2: 24 mmol/L (ref 22–32)
CREATININE: 0.88 mg/dL (ref 0.61–1.24)
Calcium: 8.2 mg/dL — ABNORMAL LOW (ref 8.9–10.3)
GFR calc non Af Amer: >60 mL/min (ref >60)
Glucose, Bld: 85 mg/dL (ref 70–99)
POTASSIUM: 4.3 mmol/L (ref 3.5–5.1)
Sodium: 132 mmol/L — ABNORMAL LOW (ref 135–145)

## 2017-09-16 LAB — CULTURE, BLOOD (ROUTINE X 2)
Culture: NO GROWTH
Culture: NO GROWTH
Special Requests: ADEQUATE

## 2017-09-16 MED ORDER — ACETAMINOPHEN 500 MG PO TABS
500.0000 mg | ORAL_TABLET | Freq: Three times a day (TID) | ORAL | Status: DC
  Administered 2017-09-16 – 2017-09-19 (×7): 500 mg via ORAL
  Filled 2017-09-16 (×8): qty 1

## 2017-09-16 NOTE — Progress Notes (Signed)
PROGRESS NOTE    Corey Dorsey  NWG:[id number] DOB: [birth year] DOA: 09/05/2017 PCP: [place] [facility], FNP    Brief Narrative:  77 year old with no medical history admitted after experiencing anterior chest pain and pleuritic chest pain and found to have elevated troponin lung mass with multiple metastases who is status post biopsy on 09/07/2017.      Assessment & Plan:   Principal Problem:   Non-small cell carcinoma of lung (HCC) Active Problems:   Tobacco use disorder   Lung mass   Shortness of breath   Elevated troponin   Microcytic hypochromic anemia   Hemoptysis   Hypoxia  1 fever secondary to postobstructive pneumonia Patient likely with postobstructive pneumonia as CT chest with lung mass and pulmonary nodules concerning for mets.  Patient had repeat CT angiogram done 09/10/2017 showing worsening opacities.  Patient noted to have a temperature up to 101, on 09/10/2017.  Patient placed on IV cefepime and completed a 7-day course of treatment.  Blood cultures with no growth to date.  Continue scheduled nebs.   2.  Hemoptysis Secondary to non-small cell carcinoma noted on bronchial washings.  Patient noted to have small volumes of hemoptysis.  Status post transfusion of 1 unit packed red blood cells 09/10/2017. Hemoglobin currently at 7.5.  It is noted that patient's hemoptysis has been occurring for several weeks prior to biopsy which was done.  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 IR was consulted and recommended discussion with pulmonary about localizing hemoptysis prior to consideration of called embolization if patient were to have significant worsening hemoptysis.   Patient seen again by pulmonary on 09/12/2017 and patient subsequently underwent bronchoscopy with brushings taken which are consistent with non-small cell carcinoma..  Per pulmonary  bronchoscopy showed bleeding from anterior segment of the left upper lobe.  Per pulmonary if patient develops massive hemoptysis could target this area for IR embolization.  Radiation oncology consulted for palliative radiation secondary to hemoptysis.  Patient received radiation treatment on 09/15/2017 and second treatment will be on 09/18/2017.  3.  Lung mass status post biopsy 09/07/2017/non-small cell carcinoma.  Concerning for bronchogenic carcinoma with diffuse mets.  Patient with ongoing hemoptysis.  Patient underwent bronchoscopy 09/12/2017 with brushings taken and results pending.  Pulmonary following.  Biopsies done by interventional radiology with pathology with necrosis with focal poorly differentiated carcinoma.  Bronchial washings consistent with non-small cell carcinoma.  Patient with metastatic disease.  CT head consistent with mets.  Patient seen by oncology and feel patient is a poor candidate for any chemotherapy and recommending palliative care and palliative radiation for hemoptysis.  Continue Keppra for seizure prophylaxis.  Radiation oncology consulted for palliative radiation for hemoptysis.  Radiation oncology patient scheduled for 2 treatments 1 was on 09/15/2017 and the second 1 on 09/18/2017.  Palliative care following, pulmonary following, radiation oncology following.    4.  Hypoxia Was called by RN as patient noted to be hypoxic with sats in the 70s to 80s despite 6 L nasal cannula on 09/13/2017.  Patient noted to be hypoxic the night of 09/12/2017, with sats of 79% and patient noted to not have nasal cannula incorrectly.  Nasal cannula placed and patient noted to have sats in the 80s and O2 had to be increased to 5 L with sats of 92% overnight.  On evaluation of patient on 09/13/2017 patient with no significant distress.  O2 sats was previous  as patient noted to have clubbing and pulse ox was moved to the earlobe, O2 sats immediately improved to 100%.  Wean O2.  Status post 7 days IV  antibiotics for probable postobstructive pneumonia.  Continue nebulizer treatments.    5.  Elevated troponin 2D echo with normal EF and grade 1 diastolic dysfunction.  CT chest did show significant calcific disease.  Patient seen in consultation by cardiology who feel patient is not a very good candidate for intervention at this time particularly with his hemoptysis and possibly limited life expectancy.  Follow.  6.  Iron deficiency anemia/anemia of chronic disease/acute blood loss anemia Status post 1 unit packed red blood cells 09/10/2017.  Hemoglobin currently stable at 8.2 from 7.5 after transfusion of another unit of packed red blood cells.  Status post IV Feraheme.  Follow H&H with hemoptysis.     DVT prophylaxis: SCDs Code Status: Full Family Communication updated patient.  No family at bedside. Disposition Plan: Likely to residential hospice once patient has finished palliative radiation.   Consultants:   PCCM Dr. Lynetta Mare 09/06/2017  Cardiology Dr. Harrington Challenger 09/06/2017  Interventional radiology  Palliative care: Dr. Rowe Pavy 09/14/2017  Oncology: Dr. Lorna Few 09/13/2017  Radiation oncology: Dr. Isidore Moos 09/15/2017  Procedures:   CT angiogram chest 09/10/2017, 09/05/2017  CT left shoulder 09/07/2017  CT-guided core biopsy left lung mass per IR Dr. Kathlene Cote 09/07/2017  Chest x-ray 09/05/2017, 09/10/2017  2D echo 09/06/2017  Bronchoscopy per Dr. Lake Bells 09/12/2017  Transfuse 1 unit packed red blood cells 09/10/2017.  Transfuse 1 unit packed red blood cells 09/14/2017  CT head with and without contrast 09/13/2017.  Antimicrobials:   IV cefepime 09/11/2017>>>>>>> 09/15/2017     Subjective: Patient in bed.  Patient still with hemoptysis.  No chest pain no shortness of breath.    Objective: Vitals:   09/15/17 1507 09/15/17 2028 09/16/17 0255 09/16/17 0641  BP: 101/65 111/74 118/78 114/72  Pulse: 90 93 84 86  Resp:  18  18  Temp:  99.1 F (37.3 C)  98 F (36.7 C)    TempSrc:  Oral    SpO2:  90% 90% (!) 89%  Weight:      Height:        Intake/Output Summary (Last 24 hours) at 09/16/2017 1137 Last data filed at 09/16/2017 1040 Gross per 24 hour  Intake 696.67 ml  Output 525 ml  Net 171.67 ml   Filed Weights   09/12/17 1357 09/13/17 0640 09/15/17 0450  Weight: 57.6 kg (127 lb) 57.8 kg (127 lb 6.8 oz) 55.1 kg (121 lb 7.6 oz)    Examination:  General exam: Frail.  Cachectic.  Temporal wasting. Respiratory system: Some scattered coarse breath sounds otherwise clear.  No wheezing.  No crackles. Cardiovascular system: Regular rate and rhythm no murmurs rubs or gallops.  No lower extremity edema.  No JVD.  Gastrointestinal system: Abdomen is nondistended, soft, positive bowel sounds.  No rebound.  No guarding.   Central nervous system: Alert and oriented. No focal neurological deficits. Extremities: Symmetric 5 x 5 power. Skin: No rashes, lesions or ulcers Psychiatry: Judgement and insight appear poor to fair. Mood & affect appropriate.     Data Reviewed: I have personally reviewed following labs and imaging studies  CBC: Recent Labs  Lab 09/12/17 0520 09/13/17 0528 09/13/17 1426 09/14/17 0521 09/14/17 1828 09/15/17 0458 09/16/17 0453  WBC 7.6 8.3  --  8.2  --  9.3 8.8  NEUTROABS  --   --   --  6.6  --   --   --   HGB 8.5* 7.5* 8.3* 7.5* 8.5* 8.2* 8.5*  HCT 26.9* 24.4* 27.3* 24.3* 27.3* 26.2* 27.7*  MCV 80.1 80.0  --  80.5  --  81.9 81.7  PLT 307 315  --  298  --  306 174   Basic Metabolic Panel: Recent Labs  Lab 09/10/17 0541 09/12/17 0520 09/13/17 0528 09/14/17 0521 09/15/17 0458 09/16/17 0453  NA 134* 133* 134* 134* 136 132*  K 4.4 4.8 4.6 4.4 4.6 4.3  CL 101 102 103 103 106 103  CO2 27 24 24 23 25 24   GLUCOSE 86 84 85 109* 76 85  BUN 17 16 19 17 16 17   CREATININE 1.05 0.99 1.07 1.02 1.06 0.88  CALCIUM 8.5* 8.4* 8.2* 8.3* 8.5* 8.2*  MG 2.3  --   --  2.1  --   --    GFR: Estimated Creatinine Clearance: 53.9 mL/min  (by C-G formula based on SCr of 0.88 mg/dL). Liver Function Tests: No results for input(s): AST, ALT, ALKPHOS, BILITOT, PROT, ALBUMIN in the last 168 hours. No results for input(s): LIPASE, AMYLASE in the last 168 hours. No results for input(s): AMMONIA in the last 168 hours. Coagulation Profile: No results for input(s): INR, PROTIME in the last 168 hours. Cardiac Enzymes: No results for input(s): CKTOTAL, CKMB, CKMBINDEX, TROPONINI in the last 168 hours. BNP (last 3 results) No results for input(s): PROBNP in the last 8760 hours. HbA1C: No results for input(s): HGBA1C in the last 72 hours. CBG: No results for input(s): GLUCAP in the last 168 hours. Lipid Profile: No results for input(s): CHOL, HDL, LDLCALC, TRIG, CHOLHDL, LDLDIRECT in the last 72 hours. Thyroid Function Tests: No results for input(s): TSH, T4TOTAL, FREET4, T3FREE, THYROIDAB in the last 72 hours. Anemia Panel: No results for input(s): VITAMINB12, FOLATE, FERRITIN, TIBC, IRON, RETICCTPCT in the last 72 hours. Sepsis Labs: Recent Labs  Lab 09/10/17 1707 09/11/17 0509 09/12/17 0520  PROCALCITON 0.18 0.20 0.15    Recent Results (from the past 240 hour(s))  Culture, blood (routine x 2)     Status: None (Preliminary result)   Collection Time: 09/11/17  8:38 AM  Result Value Ref Range Status   Specimen Description   Final    BLOOD LEFT ANTECUBITAL Performed at [facility], [place] [address] [place], [place] [address]    Special Requests   Final    BOTTLES DRAWN AEROBIC AND ANAEROBIC Blood Culture results may not be optimal due to an excessive volume of blood received in culture bottles Performed at [facility] [address], [place], [place] [address]    Culture   Final    NO GROWTH 4 DAYS Performed at [facility], [place] [address] [place], [place] [address]    Report Status PENDING  Incomplete  Culture, blood (routine x 2)     Status: None (Preliminary result)   Collection  Time: 09/11/17  8:48 AM  Result Value Ref Range Status   Specimen Description   Final    BLOOD RIGHT HAND Performed at [facility] [address] [place], [place] [address]    Special Requests   Final    BOTTLES DRAWN AEROBIC AND ANAEROBIC Blood Culture adequate volume Performed at [facility] [address] [place],  [address]    Culture   Final    NO GROWTH 4 DAYS Performed at [facility], [place]  [address], [place], [place] [address]    Report Status PENDING  Incomplete         Radiology Studies: Dg Chest Port 1 View  Result Date: 09/15/2017 CLINICAL DATA:  Follow-up pneumonia EXAM: PORTABLE CHEST 1 VIEW COMPARISON:  09/10/2017 FINDINGS: Cardiac shadow is stable. Persistent bilateral infiltrates are identified left greater than right. Given some patient positioning differences the overall appearance is stable. No bony abnormality is seen. IMPRESSION: Patchy bilateral infiltrates left greater than right. Electronically Signed   By: Inez Catalina M.D.   On: 09/15/2017 07:01        Scheduled Meds: . acetaminophen  500 mg Oral TID  . ferrous sulfate  325 mg Oral Q breakfast  . levETIRAcetam  500 mg Oral BID  . lidocaine  1 patch Transdermal Q24H   Continuous Infusions:    LOS: 11 days    Time spent: 35 minutes    Irine Seal, MD [facility] Pager [phone]  If 7PM-7AM, please contact night-coverage [email or web address] Password [facility] 09/16/2017, 11:37 AM

## 2017-09-16 NOTE — Progress Notes (Signed)
Name: Corey Dorsey MRN: [id number] DOB: 02-12-1941    ADMISSION DATE:  09/05/2017 CONSULTATION DATE: 09/06/2017  CHIEF COMPLAINT: Abnormal CT chest/ hemoptysis  BRIEF PATIENT DESCRIPTION:  77 year old smoker with lung mass, multiple lung nodules status post biopsy Postobstructive pneumonia Biopsy results favors squamous cell cancer Palliative radiation treatment is a current plan  SIGNIFICANT EVENTS  Status post bronchoscopy on 09/12/2017-significant for bleeding notable in the left upper lobe Biopsy favor squamous cell cancer, evidence of metastatic disease  STUDIES:  09/13/2017>> MRI brain>>1. 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 MRI of the brain with contrast may demonstrate additional metastasis.  09/10/2017>>CT scan of the chest >> dominant left upper lobe mass measuring up to 10.7 cm, narrowing the lingular bronchus and encasing multiple small pulmonary artery branches. This is a probable source of hemoptysis. Numerous bilateral pulmonary metastatic nodules, which are also potential sources of hemoptysis.  09/12/2017>>Bronch : The airway examination of the right lung was normal. - Blood was present in the anterior segment of the left upper lobe (B3). - A blood clot was found in the anterior segment of the left upper lobe (B3). - Brushings were obtained. - Bronchoalveolar lavage was performed.  Findings: Left Lung Abnormalities: Fresh blood was found in the anterior segment of the left upper lobe (B3). The bleeding site was identified. A blot clot was found in the anterior segment of the left upper lobe (B3). It was partially obstructing the airway.   09/07/2017 Lung, needle/core biopsy(ies), left - NECROSIS WITH FOCAL POORLY DIFFERENTIATED CARCINOMA The majority of the tissue is necrotic. There is only of small focus of malignant cells.  The morphology slightly favors a squamous cell carcinoma. There is insufficient tissue for additional  studies. 09/06/2017 Echo EF 45-62%, Grade 1 diastolic dysfunction PAP 31 mm Hg RA moderately dilated Atrial septum: There was a probable, large patent foramen ovale. - Tricuspid valve: There was mild regurgitation.  HISTORY OF PRESENT ILLNESS:   77 year old with no medical history admitted after experiencing anterior chest pain and pleuritic chest pain and hemoptysis. He was found to have elevated troponin,  lung mass with multiple metastases who is status post biopsy on 09/07/2017. Pathology favors squamous cell carcinoma.  SUBJECTIVE:   Has no complaints at present Still brings up a little bit of blood Denies any chest pains or discomfort Denies any abdominal discomfort at present Is on room air at present   VITAL SIGNS: Temp:  [98 F (36.7 C)-99.2 F (37.3 C)] 98 F (36.7 C) (07/27 0641) Pulse Rate:  [84-93] 86 (07/27 0641) Resp:  [18-20] 18 (07/27 0641) BP: (98-118)/(65-78) 114/72 (07/27 0641) SpO2:  [89 %-92 %] 89 % (07/27 0641)  PHYSICAL EXAMINATION: General: Pleasant, comfortable  neuro: Moving all extremities, no focal findings HEENT: No nasal lesions Cardiovascular: S1-S2 appreciated  lungs: No use of accessory muscles of respiration, few rales at the bases abdomen: Soft, nontender  musculoskeletal: No obvious abnormalities Skin: No remarkable findings  Recent Labs  Lab 09/14/17 0521 09/15/17 0458 09/16/17 0453  NA 134* 136 132*  K 4.4 4.6 4.3  CL 103 106 103  CO2 _0 BUN _1 CREATININE 1.02 1.06 0.88  GLUCOSE 109* 76 85   Recent Labs  Lab 09/14/17 0521 09/14/17 1828 09/15/17 0458 09/16/17 0453  HGB 7.5* 8.5* 8.2* 8.5*  HCT 24.3* 27.3* 26.2* 27.7*  WBC 8.2  --  9.3 8.8  PLT 298  --  306 314   Dg Chest  Port 1 View  Result Date: 09/15/2017 CLINICAL DATA:  Follow-up pneumonia EXAM: PORTABLE CHEST 1 VIEW COMPARISON:  09/10/2017 FINDINGS: Cardiac shadow is stable. Persistent bilateral infiltrates are identified left greater than right.  Given some patient positioning differences the overall appearance is stable. No bony abnormality is seen. IMPRESSION: Patchy bilateral infiltrates left greater than right. Electronically Signed   By: Inez Catalina M.D.   On: 09/15/2017 07:01    ASSESSMENT / PLAN:  Postobstructive pneumonia Status post bronchoscopy 7/18 Completed cefepime Cytology from bronchoscopy  favors squamous cell carcinoma  Plan: He completed antibiotics Chest x-ray with patchy bilateral infiltrates-reviewed by myself-does not have any acute symptoms that is concerning for progressive infection  Hypoxemia>> Improving Plan: Titrate oxygen for sats 89 percent-currently off oxygen supplementation Pulmonary toilet as patient can tolerate   Hemoptysis/ Anemia Continues to cough up blood tinged secretions-this is improving HGB has stabilized Status post transfusion 7/21 Currently receiving iron dextran Plan: Trend CBC Transfuse as needed Will follow  Lung mass-biopsied 09/07/2017 :Pathology favors squamous cell carcinoma-for palliative treatment Seen by Dr. Earlie Server 7/24>> Does not feel candidate for systemic chemo >> recommends palliative care with palliative radiotherapy to largest mass to decrease hemoptysis.  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

## 2017-09-17 DIAGNOSIS — D491 Neoplasm of unspecified behavior of respiratory system: Secondary | ICD-10-CM

## 2017-09-17 DIAGNOSIS — J189 Pneumonia, unspecified organism: Secondary | ICD-10-CM

## 2017-09-17 LAB — HEMOGLOBIN AND HEMATOCRIT, BLOOD
HCT: 25.8 % — ABNORMAL LOW (ref 39.0–52.0)
Hemoglobin: 8 g/dL — ABNORMAL LOW (ref 13.0–17.0)

## 2017-09-17 NOTE — Progress Notes (Signed)
PROGRESS NOTE    Corey Dorsey  SHF:[id number] DOB: 06-11-1940 DOA: 09/05/2017 PCP: [facility], FNP    Brief Narrative:  77 year old with no medical history admitted after experiencing anterior chest pain and pleuritic chest pain and found to have elevated troponin lung mass with multiple metastases who is status post biopsy on 09/07/2017.      Assessment & Plan:   Principal Problem:   Non-small cell carcinoma of lung (HCC) Active Problems:   Tobacco use disorder   Lung mass   Shortness of breath   Elevated troponin   Microcytic hypochromic anemia   Hemoptysis   Hypoxia  1 fever secondary to postobstructive pneumonia Patient likely with postobstructive pneumonia as CT chest with lung mass and pulmonary nodules concerning for mets.  Patient had repeat CT angiogram done 09/10/2017 showing worsening opacities.  Patient noted to have a temperature up to 101, on 09/10/2017.  Patient placed on IV cefepime and completed a 7-day course of treatment.  Blood cultures with no growth to date.  Continue scheduled nebs.   2.  Hemoptysis Secondary to non-small cell carcinoma noted on bronchial washings.  Patient noted to have hemoptysis overnight and per patient some this morning which is slowly clearing up.   Status post transfusion of 2 units packed red blood cells during this hospitalization. Hemoglobin currently at 8.0.  It is noted that patient's hemoptysis has been occurring for several weeks prior to biopsy which was done.  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 IR was consulted and recommended discussion with pulmonary about localizing hemoptysis prior to consideration of called embolization if patient were to have significant worsening hemoptysis.   Patient seen again by pulmonary on 09/12/2017 and patient subsequently underwent bronchoscopy with brushings taken  which are consistent with non-small cell carcinoma..  Per pulmonary bronchoscopy showed bleeding from anterior segment of the left upper lobe.  Per pulmonary if patient develops massive hemoptysis could target this area for IR embolization.  Radiation oncology consulted for palliative radiation secondary to hemoptysis.  Patient received radiation treatment on 09/15/2017 and second treatment will be on 09/18/2017.  3.  Lung mass status post biopsy 09/07/2017/non-small cell carcinoma.  Concerning for bronchogenic carcinoma with diffuse mets.  Patient with ongoing hemoptysis.  Patient underwent bronchoscopy 09/12/2017 with brushings taken and results pending.  Pulmonary following.  Biopsies done by interventional radiology with pathology with necrosis with focal poorly differentiated carcinoma.  Bronchial washings consistent with non-small cell carcinoma.  Patient with metastatic disease.  CT head consistent with mets.  Patient seen by oncology and feel patient is a poor candidate for any chemotherapy and recommending palliative care and palliative radiation for hemoptysis.  Continue Keppra for seizure prophylaxis.  Radiation oncology consulted for palliative radiation for hemoptysis.  Radiation oncology has scheduled for 2 treatments 1 was on 09/15/2017 and the second 1 to be on 09/18/2017.  Palliative care following, pulmonary following, radiation oncology following.    4.  Hypoxia Was called by RN as patient noted to be hypoxic with sats in the 70s to 80s despite 6 L nasal cannula on 09/13/2017.  Patient noted to be hypoxic the night of 09/12/2017, with sats of 79% and patient noted to not have nasal cannula incorrectly.  Nasal cannula placed and patient noted to have sats in the 80s and O2 had to be increased to 5 L with sats of 92% overnight.  On evaluation  of patient on 09/13/2017 patient with no significant distress.  O2 sats was previous as patient noted to have clubbing and pulse ox was moved to the earlobe, O2  sats immediately improved to 100%.  Wean O2.  Status post 7 days IV antibiotics for probable postobstructive pneumonia.  Continue nebulizer treatments.    5.  Elevated troponin 2D echo with normal EF and grade 1 diastolic dysfunction.  CT chest did show significant calcific disease.  Patient seen in consultation by cardiology who feel patient is not a very good candidate for intervention at this time particularly with his hemoptysis and possibly limited life expectancy.  Follow.  6.  Iron deficiency anemia/anemia of chronic disease/acute blood loss anemia Status post 1 unit packed red blood cells 09/10/2017.  Hemoglobin currently stable at 8.0 from 8.5 from 8.2 from 7.5 after transfusion of another unit of packed red blood cells.  Status post IV Feraheme.  Follow H&H with hemoptysis.     DVT prophylaxis: SCDs Code Status: Full Family Communication updated patient.  No family at bedside. Disposition Plan: Likely to residential hospice once patient has finished palliative radiation.   Consultants:   PCCM Dr. Lynetta Mare 09/06/2017  Cardiology Dr. Harrington Challenger 09/06/2017  Interventional radiology  Palliative care: Dr. Rowe Pavy 09/14/2017  Oncology: Dr. Lorna Few 09/13/2017  Radiation oncology: Dr. Isidore Moos 09/15/2017  Procedures:   CT angiogram chest 09/10/2017, 09/05/2017  CT left shoulder 09/07/2017  CT-guided core biopsy left lung mass per IR Dr. Kathlene Cote 09/07/2017  Chest x-ray 09/05/2017, 09/10/2017  2D echo 09/06/2017  Bronchoscopy per Dr. Lake Bells 09/12/2017  Transfuse 1 unit packed red blood cells 09/10/2017.  Transfuse 1 unit packed red blood cells 09/14/2017  CT head with and without contrast 09/13/2017.  Antimicrobials:   IV cefepime 09/11/2017>>>>>>> 09/15/2017     Subjective: Patient sleeping however easily arousable.  Patient states had some hemoptysis this morning however states a little bit lighter now.  Per RN patient with multiple episodes of hemoptysis which were  described as bright red overnight.  No chest pain.  No shortness of breath.    Objective: Vitals:   09/16/17 1422 09/16/17 2028 09/16/17 2202 09/17/17 0448  BP: 108/66 (!) 95/56  106/70  Pulse: 93 88  91  Resp: 16 16  16   Temp: 98.2 F (36.8 C) 97.7 F (36.5 C)  (!) 97.5 F (36.4 C)  TempSrc: Oral Oral  Oral  SpO2: 93% 91% 93% 91%  Weight:      Height:        Intake/Output Summary (Last 24 hours) at 09/17/2017 1128 Last data filed at 09/17/2017 0901 Gross per 24 hour  Intake 380 ml  Output 225 ml  Net 155 ml   Filed Weights   09/12/17 1357 09/13/17 0640 09/15/17 0450  Weight: 57.6 kg (127 lb) 57.8 kg (127 lb 6.8 oz) 55.1 kg (121 lb 7.6 oz)    Examination:  General exam: Frail.  Cachectic.  Temporal wasting. Respiratory system: Lungs clear to auscultation bilaterally.  No wheezes, no crackles, no rhonchi.   Cardiovascular system: RRR no murmurs rubs or gallops.  No JVD.  No lower extremity edema.   Gastrointestinal system: Abdomen is soft, nontender, nondistended, positive bowel sounds.  No rebound.  No guarding.   Central nervous system: Alert and oriented. No focal neurological deficits. Extremities: Symmetric 5 x 5 power. Skin: No rashes, lesions or ulcers Psychiatry: Judgement and insight appear poor to fair. Mood & affect appropriate.     Data Reviewed: I have personally  reviewed following labs and imaging studies  CBC: Recent Labs  Lab 09/12/17 0520 09/13/17 0528  09/14/17 0521 09/14/17 1828 09/15/17 0458 09/16/17 0453 09/17/17 0405  WBC 7.6 8.3  --  8.2  --  9.3 8.8  --   NEUTROABS  --   --   --  6.6  --   --   --   --   HGB 8.5* 7.5*   < > 7.5* 8.5* 8.2* 8.5* 8.0*  HCT 26.9* 24.4*   < > 24.3* 27.3* 26.2* 27.7* 25.8*  MCV 80.1 80.0  --  80.5  --  81.9 81.7  --   PLT 307 315  --  298  --  306 314  --    < > = values in this interval not displayed.   Basic Metabolic Panel: Recent Labs  Lab 09/12/17 0520 09/13/17 0528 09/14/17 0521 09/15/17 0458  09/16/17 0453  NA 133* 134* 134* 136 132*  K 4.8 4.6 4.4 4.6 4.3  CL 102 103 103 106 103  CO2 24 24 23 25 24   GLUCOSE 84 85 109* 76 85  BUN 16 19 17 16 17   CREATININE 0.99 1.07 1.02 1.06 0.88  CALCIUM 8.4* 8.2* 8.3* 8.5* 8.2*  MG  --   --  2.1  --   --    GFR: Estimated Creatinine Clearance: 53.9 mL/min (by C-G formula based on SCr of 0.88 mg/dL). Liver Function Tests: No results for input(s): AST, ALT, ALKPHOS, BILITOT, PROT, ALBUMIN in the last 168 hours. No results for input(s): LIPASE, AMYLASE in the last 168 hours. No results for input(s): AMMONIA in the last 168 hours. Coagulation Profile: No results for input(s): INR, PROTIME in the last 168 hours. Cardiac Enzymes: No results for input(s): CKTOTAL, CKMB, CKMBINDEX, TROPONINI in the last 168 hours. BNP (last 3 results) No results for input(s): PROBNP in the last 8760 hours. HbA1C: No results for input(s): HGBA1C in the last 72 hours. CBG: No results for input(s): GLUCAP in the last 168 hours. Lipid Profile: No results for input(s): CHOL, HDL, LDLCALC, TRIG, CHOLHDL, LDLDIRECT in the last 72 hours. Thyroid Function Tests: No results for input(s): TSH, T4TOTAL, FREET4, T3FREE, THYROIDAB in the last 72 hours. Anemia Panel: No results for input(s): VITAMINB12, FOLATE, FERRITIN, TIBC, IRON, RETICCTPCT in the last 72 hours. Sepsis Labs: Recent Labs  Lab 09/10/17 1707 09/11/17 0509 09/12/17 0520  PROCALCITON 0.18 0.20 0.15    Recent Results (from the past 240 hour(s))  Culture, blood (routine x 2)     Status: None   Collection Time: 09/11/17  8:38 AM  Result Value Ref Range Status   Specimen Description   Final    BLOOD LEFT ANTECUBITAL Performed at [facility], [place] [address] [place], [place] [address]    Special Requests   Final    BOTTLES DRAWN AEROBIC AND ANAEROBIC Blood Culture results may not be optimal due to an excessive volume of blood received in culture bottles Performed at [facility] [address] [place], [place] [address]    Culture   Final    NO GROWTH 5 DAYS Performed at [facility], [place] [address] [place], [place] [address]    Report Status 09/16/2017 FINAL  Final  Culture, blood (routine x 2)     Status: None   Collection Time: 09/11/17  8:48 AM  Result Value Ref Range Status   Specimen Description   Final    BLOOD RIGHT HAND  Performed at [facility], [place] [address], [place], [place] [address]    Special Requests   Final    BOTTLES DRAWN AEROBIC AND ANAEROBIC Blood Culture adequate volume Performed at [place] [address] [place], [place] [address]    Culture   Final    NO GROWTH 5 DAYS Performed at [facility], [place] [address], [place], [place] [address]    Report Status 09/16/2017 FINAL  Final         Radiology Studies: No results found.      Scheduled Meds: . acetaminophen  500 mg Oral TID  . ferrous sulfate  325 mg Oral Q breakfast  . levETIRAcetam  500 mg Oral BID  . lidocaine  1 patch Transdermal Q24H   Continuous Infusions:    LOS: 12 days    Time spent: 35 minutes    Irine Seal, MD [facility] Pager [phone]  If 7PM-7AM, please contact night-coverage [email or web address] Password [facility] 09/17/2017, 11:28 AM

## 2017-09-17 NOTE — Progress Notes (Signed)
Name: Corey Dorsey MRN: [id number] DOB: 11-Oct-1940    ADMISSION DATE:  09/05/2017 CONSULTATION DATE: 09/06/2017  CHIEF COMPLAINT: Abnormal CT chest/ hemoptysis  BRIEF PATIENT DESCRIPTION:  77 year old smoker with lung mass, multiple lung nodules status post biopsy Postobstructive pneumonia Biopsy results favors squamous cell cancer   SIGNIFICANT EVENTS  Status post bronchoscopy on 09/12/2017-significant for bleeding notable in the left upper lobe Biopsy favor squamous cell cancer, evidence of metastatic disease Still has mild hemoptysis Was ambulating earlier today and currently played out   STUDIES:  09/13/2017>> MRI brain>>1. 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 MRI of the brain with contrast may demonstrate additional metastasis.  09/10/2017>>CT scan of the chest >> dominant left upper lobe mass measuring up to 10.7 cm, narrowing the lingular bronchus and encasing multiple small pulmonary artery branches. This is a probable source of hemoptysis. Numerous bilateral pulmonary metastatic nodules, which are also potential sources of hemoptysis.  09/12/2017>>Bronch : The airway examination of the right lung was normal. - Blood was present in the anterior segment of the left upper lobe (B3). - A blood clot was found in the anterior segment of the left upper lobe (B3). - Brushings were obtained. - Bronchoalveolar lavage was performed. Findings: Left Lung Abnormalities: Fresh blood was found in the anterior segment of the left upper lobe (B3). The bleeding site was identified. A blot clot was found in the anterior segment of the left upper lobe (B3). It was partially obstructing the airway.   09/07/2017 Lung, needle/core biopsy(ies), left - NECROSIS WITH FOCAL POORLY DIFFERENTIATED CARCINOMA The majority of the tissue is necrotic. There is only of small focus of malignant cells.  The morphology slightly favors a squamous cell carcinoma. There is insufficient  tissue for additional studies. 09/06/2017 Echo EF 13-08%, Grade 1 diastolic dysfunction PAP 31 mm Hg RA moderately dilated Atrial septum: There was a probable, large patent foramen ovale. - Tricuspid valve: There was mild regurgitation.  HISTORY OF PRESENT ILLNESS:   77 year old with no medical history admitted after experiencing anterior chest pain and pleuritic chest pain and hemoptysis. He was found to have elevated troponin,  lung mass with multiple metastases who is status post biopsy on 09/07/2017. Pathology favors squamous cell carcinoma.  SUBJECTIVE:  Continued  hemoptysis post bronchoscopy 09/07/2017 with drop in HGB 7/25 States he continues to cough up bloody secretions- improving Surgical pathology favors squamous cell carcinoma Remains on 3 L [place] with saturation of 94% Denies any acute complaints today, feeling relatively well, tired from ambulating this morning   VITAL SIGNS: Temp:  [97.5 F (36.4 C)-98.2 F (36.8 C)] 97.5 F (36.4 C) (07/28 0448) Pulse Rate:  [88-93] 91 (07/28 0448) Resp:  [16] 16 (07/28 0448) BP: (95-108)/(56-70) 106/70 (07/28 0448) SpO2:  [91 %-93 %] 91 % (07/28 0448)  PHYSICAL EXAMINATION: General: Elderly gentleman, pleasant, tired, does not appear to be in distress Neuro: Extremities with no focal findings HEENT: No nasal lesions, no oral lesions Cardiovascular: S1-S2 appreciated  Lungs: Poor air movement bilaterally, no use of accessory muscles, clear clinically abdomen: Soft, bowel sounds appreciated Musculoskeletal: No obvious deformities  skin: No remarkable findings  Recent Labs  Lab 09/14/17 0521 09/15/17 0458 09/16/17 0453  NA 134* 136 132*  K 4.4 4.6 4.3  CL 103 106 103  CO2 _0 BUN _1 CREATININE 1.02 1.06 0.88  GLUCOSE 109* 76 85   Recent Labs  Lab 09/14/17 0521  09/15/17 0458 09/16/17 0453  09/17/17 0405  HGB 7.5*   < > 8.2* 8.5* 8.0*  HCT 24.3*   < > 26.2* 27.7* 25.8*  WBC 8.2  --  9.3 8.8  --   PLT  298  --  306 314  --    < > = values in this interval not displayed.   No results found.  ASSESSMENT / PLAN:  Postobstructive pneumonia Status post bronchoscopy 7/18 Completed cefepime Cytology from bronchoscopy  favors squamous cell carcinoma Plan: Completed antibiotics Chest x-ray still significant for patchy infiltrates-clinically stable  Hypoxemia>> Improving Plan: Titrate oxygen for sats 88-92% Pulmonary toileting as tolerated   Hemoptysis/ Anemia Continues to cough up blood tinged secretions H/H is stable Currently receiving iron dextran  Plan: Trend CBC-has been stable Transfuse per primary team  Will follow  Lung mass-biopsied 09/07/2017 :Pathology favors squamous cell carcinoma Seen by Dr. Earlie Server 7/24>> Does not feel candidate for systemic chemo >> recommends palliative care with palliative radiotherapy to largest mass to decrease hemoptysis. 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  Overall, still not at baseline yet Agree with palliative care goals  and palliative  radiotherapy to large mass to decrease hemoptysis.   Palliation discussions are ongoing with family.  Sherrilyn Rist, MD  09/17/2017, 11:53 AM

## 2017-09-17 NOTE — Progress Notes (Signed)
Pt starting to cough up small amounts of bloody mucus.

## 2017-09-18 ENCOUNTER — Ambulatory Visit
Admit: 2017-09-18 | Discharge: 2017-09-18 | Disposition: A | Discharge status: Home / Self Care | Payer: Medicare Other | Attending: Radiation Oncology | Admitting: Radiation Oncology

## 2017-09-18 ENCOUNTER — Encounter: Payer: Self-pay | Admitting: Radiation Oncology

## 2017-09-18 DIAGNOSIS — C3412 Malignant neoplasm of upper lobe, left bronchus or lung: Secondary | ICD-10-CM | POA: Insufficient documentation

## 2017-09-18 LAB — HEMOGLOBIN AND HEMATOCRIT, BLOOD
HCT: 24.5 % — ABNORMAL LOW (ref 39.0–52.0)
HCT: 33.3 % — ABNORMAL LOW (ref 39.0–52.0)
HEMOGLOBIN: 10.5 g/dL — AB (ref 13.0–17.0)
Hemoglobin: 7.6 g/dL — ABNORMAL LOW (ref 13.0–17.0)

## 2017-09-18 LAB — PREPARE RBC (CROSSMATCH)

## 2017-09-18 MED ORDER — SODIUM CHLORIDE 0.9% IV SOLUTION
Freq: Once | INTRAVENOUS | Status: AC
  Administered 2017-09-18: 14:00:00 via INTRAVENOUS

## 2017-09-18 MED ORDER — DICLOFENAC SODIUM 1 % TD GEL
2.0000 g | Freq: Four times a day (QID) | TRANSDERMAL | Status: DC | PRN
  Filled 2017-09-18: qty 100

## 2017-09-18 MED ORDER — FUROSEMIDE 10 MG/ML IJ SOLN
20.0000 mg | Freq: Once | INTRAMUSCULAR | Status: AC
  Administered 2017-09-18: 20 mg via INTRAVENOUS
  Filled 2017-09-18: qty 2

## 2017-09-18 MED ORDER — IPRATROPIUM-ALBUTEROL 0.5-2.5 (3) MG/3ML IN SOLN
3.0000 mL | RESPIRATORY_TRACT | Status: DC | PRN
  Administered 2017-09-19: 3 mL via RESPIRATORY_TRACT
  Filled 2017-09-18: qty 3

## 2017-09-18 MED ORDER — ACETAMINOPHEN 325 MG PO TABS
650.0000 mg | ORAL_TABLET | Freq: Once | ORAL | Status: AC
  Administered 2017-09-18: 650 mg via ORAL

## 2017-09-18 MED ORDER — DIPHENHYDRAMINE HCL 50 MG/ML IJ SOLN
12.5000 mg | Freq: Once | INTRAMUSCULAR | Status: AC
  Administered 2017-09-18: 12.5 mg via INTRAVENOUS
  Filled 2017-09-18: qty 1

## 2017-09-18 MED ORDER — TIOTROPIUM BROMIDE MONOHYDRATE 18 MCG IN CAPS
18.0000 ug | ORAL_CAPSULE | Freq: Every day | RESPIRATORY_TRACT | Status: DC
  Administered 2017-09-19: 18 ug via RESPIRATORY_TRACT
  Filled 2017-09-18: qty 5

## 2017-09-18 MED ORDER — IBUPROFEN 200 MG PO TABS: 400.0000 mg | ORAL_TABLET | Freq: Three times a day (TID) | ORAL | Status: DC

## 2017-09-18 MED ORDER — MOMETASONE FURO-FORMOTEROL FUM 100-5 MCG/ACT IN AERO
2.0000 | INHALATION_SPRAY | Freq: Two times a day (BID) | RESPIRATORY_TRACT | Status: DC
  Filled 2017-09-18: qty 8.8

## 2017-09-18 NOTE — Progress Notes (Signed)
Daily Progress Note   Patient Name: Corey Dorsey       Date: 09/18/2017 DOB: [birth year]  Age: 77 y.o. MRN#: [id number] Attending Physician: Eugenie Filler, MD Primary Care Physician: Golden Circle, FNP Admit Date: 09/05/2017  Reason for Consultation/Follow-up: Establishing goals of care, Non pain symptom management and Pain control  Subjective: Awake alert  Resting in bed His daughter is at bedside Continues to have hemoptysis Is complaining of pain in his ribs   Length of Stay: 13  Current Medications: Scheduled Meds:  . sodium chloride   Intravenous Once  . acetaminophen  500 mg Oral TID  . acetaminophen  650 mg Oral Once  . diphenhydrAMINE  12.5 mg Intravenous Once  . ferrous sulfate  325 mg Oral Q breakfast  . furosemide  20 mg Intravenous Once  . levETIRAcetam  500 mg Oral BID  . lidocaine  1 patch Transdermal Q24H  . mometasone-formoterol  2 puff Inhalation BID  . tiotropium  18 mcg Inhalation Daily    Continuous Infusions:   PRN Meds: acetaminophen **OR** acetaminophen, diclofenac sodium, guaiFENesin-dextromethorphan, ipratropium-albuterol, ondansetron **OR** ondansetron (ZOFRAN) IV, oxyCODONE, zolpidem  Physical Exam         Awake alert Resting in bed Shallow clear breath sounds Is thin, has muscle wasting S1 S2 No edema Weak frail appearing Abdomen soft not distended Has hemoptysis: spitting up bright red blood  Vital Signs: BP 103/71 (BP Location: Left Arm)   Pulse 81   Temp 98.1 F (36.7 C) (Oral)   Resp 20   Ht 5\' 8"  (1.727 m)   Wt 53.4 kg (117 lb 11.6 oz)   SpO2 91%   BMI 17.90 kg/m  SpO2: SpO2: 91 % O2 Device: O2 Device: Nasal Cannula O2 Flow Rate: O2 Flow Rate (L/min): 4 L/min  Intake/output summary:   Intake/Output Summary  (Last 24 hours) at 09/18/2017 1338 Last data filed at 09/18/2017 1320 Gross per 24 hour  Intake 151.8 ml  Output 500 ml  Net -348.2 ml   LBM: Last BM Date: (pt unsure and none documented recently) Baseline Weight: Weight: 56.5 kg (124 lb 9 oz) Most recent weight: Weight: 53.4 kg (117 lb 11.6 oz)      PPS 30% Palliative Assessment/Data:    Flowsheet Rows     Most Recent Value  Intake Tab  Referral Department  Hospitalist  Unit at Time of Referral  -- [TELEMENTRY/UROLOGY]  Palliative Care Primary Diagnosis  Pulmonary  Date Notified  09/13/17  Palliative Care Type  New Palliative care  Reason for referral  Clarify Goals of Care  Date of Admission  09/05/17  Date first seen by Palliative Care  09/13/17  # of days Palliative referral response time  0 Day(s)  # of days IP prior to Palliative referral  8  Clinical Assessment  Psychosocial & Spiritual Assessment  Palliative Care Outcomes      Patient Active Problem List   Diagnosis Date Noted  . Malignant neoplasm of upper lobe, left bronchus or lung ([facility]) 09/18/2017  . Non-small cell carcinoma of lung ([facility]) 09/15/2017  . Hypoxia   . Hemoptysis   . Lung mass 09/05/2017  . Shortness of breath 09/05/2017  . Elevated troponin 09/05/2017  . Microcytic hypochromic anemia 09/05/2017  . Mass of abdomen 11/15/2016  . Elevated LDL cholesterol level 11/15/2016  . Dental abscess 05/09/2015  . Medicare annual wellness visit, subsequent 03/31/2015  . Tobacco use disorder 03/31/2015  . 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   Recommendations/Plan:  1. Per PCCM: IR guided embolizatin as palliative attempt to prevent hemoptysis  2. Topicals such as lidocaine patch and Voltaren gel on rib cage. Also has opioids prn.  3. Seen by [facility] [place] on consideration  for transfer to residential hospice, towards the end of this hospitalization.   Discussed with patient and daughter [name] who is at the bedside.      Code Status:    Code Status Orders  (From admission, onward)        Start     Ordered   09/14/17 1249  Do not attempt resuscitation (DNR)  Continuous    Question Answer Comment  In the event of cardiac or respiratory ARREST Do not call a "code blue"   In the event of cardiac or respiratory ARREST Do not perform Intubation, CPR, defibrillation or ACLS   In the event of cardiac or respiratory ARREST Use medication by any route, position, wound care, and other measures to relive pain and suffering. May use oxygen, suction and manual treatment of airway obstruction as needed for comfort.      09/14/17 1249    Code Status History    Date Active Date Inactive Code Status Order ID Comments User Context   09/05/2017 2259 09/14/2017 1249 Full Code [id number]  Rise Patience, MD Inpatient       Prognosis:   guarded  Discharge Planning:  Hospice facility  Care plan was discussed with  Patient, daughter.   Thank you for allowing the Palliative Medicine Team to assist in the care of this patient.   Time In: 1100 Time Out: 1125 Total Time 25 Prolonged Time Billed  no       Greater than 50%  of this time was spent counseling and coordinating care related to the above assessment and plan.  Loistine Chance, MD [phone]  Please contact Palliative Medicine Team phone at [phone] for questions and concerns.

## 2017-09-18 NOTE — Progress Notes (Signed)
D/w Dr Pascal Lux of IR - he feels prob of success with IR embolization  given tumor burdea and appearance of mass on CT Is low. So based on this will hold off IR embolization. Agree with earlier discussion about hospice with caveat that everyone is in understanding that massive hemoptysis could be source of death.   Will be available to discuss 09/19/17  Patient not see personally 09/18/2017    Dr. Brand Males, M.D., [facility].C.P Pulmonary and Critical Care Medicine Staff Physician, [place] Director - Interstitial Lung Disease  Program  Pulmonary [place] at [place], [place], [address]  Pager: [phone], If no answer or between  15:00h - 7:00h: call [phone] Telephone: [phone]

## 2017-09-18 NOTE — Progress Notes (Signed)
S: went to see patient but he was down in XRT. D/w RN - says regularly coughs up blood. 2 x RNsuspect total volume in a day could be a cup  09/12/17 bronch - bleed is fromLUL anterior segment  A/p LUL mass with hemoptysis  P - d/w Dr Julien Nordmann oncology and Dr Grandville Silos primary  =- ccm recommends IR guided embolizatin as palliative attempt to prevent hemoptysis (massive) related death   - IR order placed  - per Dr Julien Nordmann ECOG 4 and not chemo eligible  Dr. Brand Males, M.D., [facility].C.P Pulmonary and Critical Care Medicine Staff Physician, [place] Director - Interstitial Lung Disease  Program  Pulmonary [place] at [place], [place], [address]  Pager: [phone], If no answer or between  15:00h - 7:00h: call [phone] Telephone: [phone]

## 2017-09-18 NOTE — Progress Notes (Signed)
PROGRESS NOTE    Corey Dorsey  JKK:[id number] DOB: 12-Jun-1940 DOA: 09/05/2017 PCP: Golden Circle, FNP    Brief Narrative:  77 year old with no medical history admitted after experiencing anterior chest pain and pleuritic chest pain and found to have elevated troponin lung mass with multiple metastases who is status post biopsy on 09/07/2017.      Assessment & Plan:   Principal Problem:   Non-small cell carcinoma of lung (HCC) Active Problems:   Tobacco use disorder   Lung mass   Shortness of breath   Elevated troponin   Microcytic hypochromic anemia   Hemoptysis   Hypoxia  1 fever secondary to postobstructive pneumonia Patient likely with postobstructive pneumonia as CT chest with lung mass and pulmonary nodules concerning for mets.  Patient had repeat CT angiogram done 09/10/2017 showing worsening opacities.  Patient noted to have a temperature up to 101, on 09/10/2017.  Patient placed on IV cefepime and completed a 7-day course of treatment.  Blood cultures with no growth to date.  Continue scheduled nebs.   2.  Hemoptysis Secondary to non-small cell carcinoma noted on bronchial washings.  Patient noted to have hemoptysis overnight and per patient some this morning which is slowly clearing up.   Status post transfusion of 2 units packed red blood cells during this hospitalization. Hemoglobin currently at 7.6.  It is noted that patient's hemoptysis has been occurring for several weeks prior to biopsy which was done.  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 IR was consulted and recommended discussion with pulmonary about localizing hemoptysis prior to consideration of called embolization if patient were to have significant worsening hemoptysis.   Patient seen again by pulmonary on 09/12/2017 and patient subsequently underwent bronchoscopy with brushings taken  which are consistent with non-small cell carcinoma..  Per pulmonary bronchoscopy showed bleeding from anterior segment of the left upper lobe.  Per pulmonary if patient develops massive hemoptysis could target this area for IR embolization.  Radiation oncology consulted for palliative radiation secondary to hemoptysis.  Patient received radiation treatment on 09/15/2017 and second treatment will be today 09/18/2017. Per pulmonary, concern for massive hemoptysis related to death and as such pulmonary to discuss with IR for possible call guided embolization as a palliative attempt to prevent massive hemoptysis.  3.  Lung mass status post biopsy 09/07/2017/non-small cell carcinoma.  Concerning for bronchogenic carcinoma with diffuse mets.  Patient with ongoing hemoptysis.  Patient underwent bronchoscopy 09/12/2017 with brushings taken and results pending.  Pulmonary following.  Biopsies done by interventional radiology with pathology with necrosis with focal poorly differentiated carcinoma.  Bronchial washings consistent with non-small cell carcinoma.  Patient with metastatic disease.  CT head consistent with mets.  Patient seen by oncology and feel patient is a poor candidate for any chemotherapy and recommending palliative care and palliative radiation for hemoptysis.  Continue Keppra for seizure prophylaxis.  Radiation oncology consulted for palliative radiation for hemoptysis.  Radiation oncology has scheduled for 2 treatments 1 was on 09/15/2017 and the second 1 to be done today, 09/18/2017.  Palliative care following, pulmonary following, radiation oncology following.    4.  Hypoxia Was called by RN as patient noted to be hypoxic with sats in the 70s to 80s despite 6 L nasal cannula on 09/13/2017.  Patient noted to be hypoxic the night of 09/12/2017, with sats of 79% and patient noted to not have nasal cannula  incorrectly.  Nasal cannula placed and patient noted to have sats in the 80s and O2 had to be increased to  5 L with sats of 92% overnight.  On evaluation of patient on 09/13/2017 patient with no significant distress.  O2 sats was previous as patient noted to have clubbing and pulse ox was moved to the earlobe, O2 sats immediately improved to 100%.  Wean O2.  Status post 7 days IV antibiotics for probable postobstructive pneumonia.  Continue nebulizer treatments.    5.  Elevated troponin 2D echo with normal EF and grade 1 diastolic dysfunction.  CT chest did show significant calcific disease.  Patient seen in consultation by cardiology who feel patient is not a very good candidate for intervention at this time particularly with his hemoptysis and possibly limited life expectancy.  Follow.  6.  Iron deficiency anemia/anemia of chronic disease/acute blood loss anemia Hemoglobin at 7.6 this morning.  Patient status post a total of 2 units packed red blood cells during this hospitalization.  Patient also received a dose of IV Feraheme.  Patient with ongoing hemoptysis.  Transfuse 2 more units packed red blood cells.     DVT prophylaxis: SCDs Code Status: Full Family Communication updated patient.  No family at bedside. Disposition Plan: Likely to residential hospice once patient has finished palliative radiation.   Consultants:   PCCM Dr. Lynetta Mare 09/06/2017  Cardiology Dr. Harrington Challenger 09/06/2017  Interventional radiology  Palliative care: Dr. Rowe Pavy 09/14/2017  Oncology: Dr. Lorna Few 09/13/2017  Radiation oncology: Dr. Isidore Moos 09/15/2017  Procedures:   CT angiogram chest 09/10/2017, 09/05/2017  CT left shoulder 09/07/2017  CT-guided core biopsy left lung mass per IR Dr. Kathlene Cote 09/07/2017  Chest x-ray 09/05/2017, 09/10/2017  2D echo 09/06/2017  Bronchoscopy per Dr. Lake Bells 09/12/2017  Transfuse 1 unit packed red blood cells 09/10/2017.  Transfuse 1 unit packed red blood cells 09/14/2017  CT head with and without contrast 09/13/2017.  Transfuse 2 units packed red blood cells.   09/18/2017  Antimicrobials:   IV cefepime 09/11/2017>>>>>>> 09/15/2017     Subjective: Patient complaining of rib pain.  Denies any shortness of breath.  No chest pain.  Patient still with ongoing hemoptysis.    Objective: Vitals:   09/17/17 1435 09/17/17 2032 09/17/17 2202 09/18/17 0456  BP: (!) 94/56 107/70  92/64  Pulse: 79 91  85  Resp: 18 (!) 22  18  Temp: 98.4 F (36.9 C) 98.4 F (36.9 C)  98.2 F (36.8 C)  TempSrc: Oral Oral  Oral  SpO2: 93% (!) 86% 91% (!) 88%  Weight:    53.4 kg (117 lb 11.6 oz)  Height:        Intake/Output Summary (Last 24 hours) at 09/18/2017 1037 Last data filed at 09/18/2017 0600 Gross per 24 hour  Intake 120 ml  Output 500 ml  Net -380 ml   Filed Weights   09/13/17 0640 09/15/17 0450 09/18/17 0456  Weight: 57.8 kg (127 lb 6.8 oz) 55.1 kg (121 lb 7.6 oz) 53.4 kg (117 lb 11.6 oz)    Examination:  General exam: Frail.  Cachectic.  Temporal wasting. Respiratory system: CTAB.  No wheezes, no crackles, no rhonchi.  Cardiovascular system: Regular rate and rhythm no murmurs rubs or gallops.  No JVD.  No lower extremity edema.  Gastrointestinal system: Abdomen is nontender, nondistended, soft, positive bowel sounds.  No rebound.  No guarding.  Central nervous system: Alert and oriented. No focal neurological deficits. Extremities: Symmetric 5 x 5 power. Skin: No  rashes, lesions or ulcers Psychiatry: Judgement and insight appear poor to fair. Mood & affect appropriate.     Data Reviewed: I have personally reviewed following labs and imaging studies  CBC: Recent Labs  Lab 09/12/17 0520 09/13/17 0528  09/14/17 0521 09/14/17 1828 09/15/17 0458 09/16/17 0453 09/17/17 0405 09/18/17 0431  WBC 7.6 8.3  --  8.2  --  9.3 8.8  --   --   NEUTROABS  --   --   --  6.6  --   --   --   --   --   HGB 8.5* 7.5*   < > 7.5* 8.5* 8.2* 8.5* 8.0* 7.6*  HCT 26.9* 24.4*   < > 24.3* 27.3* 26.2* 27.7* 25.8* 24.5*  MCV 80.1 80.0  --  80.5  --  81.9 81.7  --    --   PLT 307 315  --  298  --  306 314  --   --    < > = values in this interval not displayed.   Basic Metabolic Panel: Recent Labs  Lab 09/12/17 0520 09/13/17 0528 09/14/17 0521 09/15/17 0458 09/16/17 0453  NA 133* 134* 134* 136 132*  K 4.8 4.6 4.4 4.6 4.3  CL 102 103 103 106 103  CO2 24 24 23 25 24   GLUCOSE 84 85 109* 76 85  BUN 16 19 17 16 17   CREATININE 0.99 1.07 1.02 1.06 0.88  CALCIUM 8.4* 8.2* 8.3* 8.5* 8.2*  MG  --   --  2.1  --   --    GFR: Estimated Creatinine Clearance: 52.3 mL/min (by C-G formula based on SCr of 0.88 mg/dL). Liver Function Tests: No results for input(s): AST, ALT, ALKPHOS, BILITOT, PROT, ALBUMIN in the last 168 hours. No results for input(s): LIPASE, AMYLASE in the last 168 hours. No results for input(s): AMMONIA in the last 168 hours. Coagulation Profile: No results for input(s): INR, PROTIME in the last 168 hours. Cardiac Enzymes: No results for input(s): CKTOTAL, CKMB, CKMBINDEX, TROPONINI in the last 168 hours. BNP (last 3 results) No results for input(s): PROBNP in the last 8760 hours. HbA1C: No results for input(s): HGBA1C in the last 72 hours. CBG: No results for input(s): GLUCAP in the last 168 hours. Lipid Profile: No results for input(s): CHOL, HDL, LDLCALC, TRIG, CHOLHDL, LDLDIRECT in the last 72 hours. Thyroid Function Tests: No results for input(s): TSH, T4TOTAL, FREET4, T3FREE, THYROIDAB in the last 72 hours. Anemia Panel: No results for input(s): VITAMINB12, FOLATE, FERRITIN, TIBC, IRON, RETICCTPCT in the last 72 hours. Sepsis Labs: Recent Labs  Lab 09/12/17 0520  PROCALCITON 0.15    Recent Results (from the past 240 hour(s))  Culture, blood (routine x 2)     Status: None   Collection Time: 09/11/17  8:38 AM  Result Value Ref Range Status   Specimen Description   Final    BLOOD LEFT ANTECUBITAL Performed at [facility], [place] [address], [place], [place] [address]    Special Requests   Final    BOTTLES  DRAWN AEROBIC AND ANAEROBIC Blood Culture results may not be optimal due to an excessive volume of blood received in culture bottles Performed at [facility] [address] [place], [place] [address]    Culture   Final    NO GROWTH 5 DAYS Performed at [facility], [place] [address], [place], [place] [address]    Report Status 09/16/2017 FINAL  Final  Culture, blood (routine  x 2)     Status: None   Collection Time: 09/11/17  8:48 AM  Result Value Ref Range Status   Specimen Description   Final    BLOOD RIGHT HAND Performed at [facility] [address] [place], [place] [address]    Special Requests   Final    BOTTLES DRAWN AEROBIC AND ANAEROBIC Blood Culture adequate volume Performed at [facility] [address] [place], [place] [address]    Culture   Final    NO GROWTH 5 DAYS Performed at [facility], [place] [address], [place], [place] [address]    Report Status 09/16/2017 FINAL  Final         Radiology Studies: No results found.      Scheduled Meds: . sodium chloride   Intravenous Once  . acetaminophen  500 mg Oral TID  . acetaminophen  650 mg Oral Once  . diphenhydrAMINE  12.5 mg Intravenous Once  . ferrous sulfate  325 mg Oral Q breakfast  . furosemide  20 mg Intravenous Once  . levETIRAcetam  500 mg Oral BID  . lidocaine  1 patch Transdermal Q24H  . mometasone-formoterol  2 puff Inhalation BID  . tiotropium  18 mcg Inhalation Daily   Continuous Infusions:    LOS: 13 days    Time spent: 35 minutes    Irine Seal, MD [facility] Pager [phone]  If 7PM-7AM, please contact night-coverage [email or web address] Password [facility] 09/18/2017, 10:37 AM

## 2017-09-18 NOTE — Progress Notes (Signed)
Consulted with RN around [facility].  Addressed daughter's questions around establishing HCPOA.  Met with Corey Dorsey this afternoon and he voices understanding of HCPOA and states several times that he wishes his daughter to make decisions for him if he is unable.    He wishes to look over paperwork with daughter prior to chaplain notarizing.  Will follow up to complete paperwork.

## 2017-09-18 NOTE — Progress Notes (Signed)
  Radiation Oncology         [phone] ________________________________  Name: Corey Dorsey MRN: [id number]  Date: 09-15-17  DOB: [birth year]  SIMULATION AND TREATMENT PLANNING NOTE  Inpatient  DIAGNOSIS:  C34.12 Malignant neoplasm of upper lobe, left bronchus or lung   NARRATIVE:  The patient was brought to the [facility].  Identity was confirmed.  All relevant records and images related to the planned course of therapy were reviewed.  The patient freely provided informed written consent to proceed with treatment after reviewing the details related to the planned course of therapy. The consent form was witnessed and verified by the simulation staff.  Consent had been obtained by patient but also from daughter Quintez Maselli (by phone) with Harlow Asa RN as witness.  Then, the patient was set-up in a stable reproducible  supine position for radiation therapy.  CT images were obtained.  Surface markings were placed.  The CT images were loaded into the planning software.    TREATMENT PLANNING NOTE: Treatment planning then occurred.  The radiation prescription was entered and confirmed.    A total of 3 medically necessary complex treatment devices were fabricated and supervised by me, in the form of 3 fields with MLCs to block heart, spinal cord, lungs. MORE FIELDS WITH MLCs MAY BE ADDED IN DOSIMETRY for dose homogeneity.  I have requested : 3D Simulation  I have requested a DVH of the following structures: target, carina, lungs, heart, spinal cord, esophagus.    The patient will receive 12 Gy in 2 fractions to the dominant left lung mass and airways adjacent to that mass.  -----------------------------------  Eppie Gibson, MD

## 2017-09-19 ENCOUNTER — Inpatient Hospital Stay (HOSPITAL_COMMUNITY): Payer: Medicare Other

## 2017-09-19 DIAGNOSIS — Z7189 Other specified counseling: Secondary | ICD-10-CM

## 2017-09-19 DIAGNOSIS — D491 Neoplasm of unspecified behavior of respiratory system: Secondary | ICD-10-CM

## 2017-09-19 DIAGNOSIS — R042 Hemoptysis: Secondary | ICD-10-CM

## 2017-09-19 DIAGNOSIS — Z515 Encounter for palliative care: Secondary | ICD-10-CM

## 2017-09-19 DIAGNOSIS — C3490 Malignant neoplasm of unspecified part of unspecified bronchus or lung: Principal | ICD-10-CM

## 2017-09-19 DIAGNOSIS — J189 Pneumonia, unspecified organism: Secondary | ICD-10-CM

## 2017-09-19 LAB — BASIC METABOLIC PANEL
ANION GAP: 8 (ref 5–15)
BUN: 24 mg/dL — ABNORMAL HIGH (ref 8–23)
CALCIUM: 8.4 mg/dL — AB (ref 8.9–10.3)
CO2: 22 mmol/L (ref 22–32)
CREATININE: 0.92 mg/dL (ref 0.61–1.24)
Chloride: 104 mmol/L (ref 98–111)
Glucose, Bld: 91 mg/dL (ref 70–99)
Potassium: 4.3 mmol/L (ref 3.5–5.1)
SODIUM: 134 mmol/L — AB (ref 135–145)

## 2017-09-19 LAB — BPAM RBC
Blood Product Expiration Date: 201908212359
Blood Product Expiration Date: 201908242359
ISSUE DATE / TIME: 201907291254
ISSUE DATE / TIME: 201907291615
UNIT TYPE AND RH: 5100
UNIT TYPE AND RH: 5100

## 2017-09-19 LAB — TYPE AND SCREEN
ABO/RH(D): O POS
Antibody Screen: NEGATIVE
UNIT DIVISION: 0
Unit division: 0

## 2017-09-19 LAB — HEMOGLOBIN AND HEMATOCRIT, BLOOD
HCT: 32.5 % — ABNORMAL LOW (ref 39.0–52.0)
HEMOGLOBIN: 10.2 g/dL — AB (ref 13.0–17.0)

## 2017-09-19 MED ORDER — FUROSEMIDE 10 MG/ML IJ SOLN: 40.0000 mg | Freq: Once | INTRAMUSCULAR | Status: DC

## 2017-09-19 MED ORDER — LIDOCAINE 5 % EX PTCH: 1.0000 | MEDICATED_PATCH | CUTANEOUS | 0 refills | Status: AC

## 2017-09-19 MED ORDER — FUROSEMIDE 10 MG/ML IJ SOLN
20.0000 mg | Freq: Once | INTRAMUSCULAR | Status: AC
  Administered 2017-09-19: 20 mg via INTRAVENOUS
  Filled 2017-09-19: qty 2

## 2017-09-19 MED ORDER — FERROUS SULFATE 325 (65 FE) MG PO TABS: 325.0000 mg | ORAL_TABLET | Freq: Two times a day (BID) | ORAL | 3 refills | Status: AC

## 2017-09-19 MED ORDER — MORPHINE SULFATE (PF) 2 MG/ML IV SOLN: 1.0000 mg | INTRAVENOUS | Status: DC | PRN

## 2017-09-19 MED ORDER — MORPHINE SULFATE (CONCENTRATE) 10 MG /0.5 ML PO SOLN: 5.0000 mg | ORAL | 0 refills | Status: AC | PRN

## 2017-09-19 MED ORDER — TIOTROPIUM BROMIDE MONOHYDRATE 18 MCG IN CAPS: 18.0000 ug | ORAL_CAPSULE | Freq: Every day | RESPIRATORY_TRACT | 0 refills | Status: AC

## 2017-09-19 MED ORDER — MORPHINE SULFATE (CONCENTRATE) 10 MG /0.5 ML PO SOLN: 5.0000 mg | ORAL | 0 refills | Status: DC | PRN

## 2017-09-19 MED ORDER — MOMETASONE FURO-FORMOTEROL FUM 100-5 MCG/ACT IN AERO: 2.0000 | INHALATION_SPRAY | Freq: Two times a day (BID) | RESPIRATORY_TRACT | Status: AC

## 2017-09-19 MED ORDER — LEVETIRACETAM 500 MG PO TABS: 500.0000 mg | ORAL_TABLET | Freq: Two times a day (BID) | ORAL | 0 refills | Status: DC

## 2017-09-19 MED ORDER — LEVETIRACETAM 500 MG PO TABS: 500.0000 mg | ORAL_TABLET | Freq: Two times a day (BID) | ORAL | 0 refills | Status: AC

## 2017-09-19 MED ORDER — IPRATROPIUM-ALBUTEROL 0.5-2.5 (3) MG/3ML IN SOLN: 3.0000 mL | RESPIRATORY_TRACT | 0 refills | Status: AC | PRN

## 2017-09-19 MED ORDER — ONDANSETRON HCL 4 MG PO TABS: 4.0000 mg | ORAL_TABLET | Freq: Four times a day (QID) | ORAL | 0 refills | Status: AC | PRN

## 2017-09-19 MED ORDER — GUAIFENESIN-DM 100-10 MG/5ML PO SYRP: 5.0000 mL | ORAL_SOLUTION | ORAL | 0 refills | Status: AC | PRN

## 2017-09-19 MED ORDER — DICLOFENAC SODIUM 1 % TD GEL: 2.0000 g | Freq: Four times a day (QID) | TRANSDERMAL | 0 refills | Status: AC | PRN

## 2017-09-19 NOTE — Care Management Important Message (Signed)
Important Message  Patient Details  Name: Corey Dorsey MRN: [id number] Date of Birth: [birth year]   Medicare Important Message Given:  Yes    Kerin Salen 09/19/2017, 11:02 AMImportant Message  Patient Details  Name: Corey Dorsey MRN: [id number] Date of Birth: 27-Nov-1940   Medicare Important Message Given:  Yes    Kerin Salen 09/19/2017, 11:02 AM

## 2017-09-19 NOTE — Progress Notes (Signed)
Patient discharging to [facility].   Patient's daughter completed paperwork. [facility] hospital liaison requested CSW schedule PTAR for 4pm.   PTAR contacted, patient's daughter [name] [phone]) notified. Packet complete, patient's RN can call report to [phone].  CSW signing off, no other needs identified at this time.  Abundio Miu, [place] Social Worker [facility] Cell#: [phone]

## 2017-09-19 NOTE — Progress Notes (Signed)
Name: Corey Dorsey MRN: [id number] DOB: Mar 17, 1940    ADMISSION DATE:  09/05/2017 CONSULTATION DATE: 09/06/2017  CHIEF COMPLAINT: Abnormal CT chest/ hemoptysis  BRIEF PATIENT DESCRIPTION:  77 year old with no medical history admitted after experiencing anterior chest pain and pleuritic chest pain and hemoptysis. He was found to have elevated troponin,  lung mass with multiple metastases who is status post biopsy on 09/07/2017. Pathology favors squamous cell carcinoma.    SIGNIFICANT EVENTS  Status post bronchoscopy on 09/12/2017-significant for bleeding notable in the left upper lobe Biopsy favor squamous cell cancer, evidence of metastatic disease  09/06/2017 Echo EF 69-62%, Grade 1 diastolic dysfunction PAP 31 mm Hg RA moderately dilated Atrial septum: There was a probable, large patent foramen ovale. - Tricuspid valve: There was mild regurgitation.    09/07/2017 Lung, needle/core biopsy(ies), left - NECROSIS WITH FOCAL POORLY DIFFERENTIATED CARCINOMA The majority of the tissue is necrotic. There is only of small focus of malignant cells.  The morphology slightly favors a squamous cell carcinoma. There is insufficient tissue for additional studies.   09/10/2017>>CT scan of the chest >> dominant left upper lobe mass measuring up to 10.7 cm, narrowing the lingular bronchus and encasing multiple small pulmonary artery branches. This is a probable source of hemoptysis. Numerous bilateral pulmonary metastatic nodules, which are also potential sources of hemoptysis.  09/12/2017>>Bronch : The airway examination of the right lung was normal. - Blood was present in the anterior segment of the left upper lobe (B3). - A blood clot was found in the anterior segment of the left upper lobe (B3). - Brushings were obtained. - Bronchoalveolar lavage was performed. Findings: Left Lung Abnormalities: Fresh blood was found in the anterior segment of the left upper lobe (B3). The bleeding  site was identified. A blot clot was found in the anterior segment of the left upper lobe (B3). It was partially obstructing the airway.     09/13/2017>> MRI brain>>1. 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 MRI of the brain with contrast may demonstrate additional metastasis.   7/27 - Continued  hemoptysis post bronchoscopy 09/07/2017 with drop in HGB 7/25 States he continues to cough up bloody secretions Surgical pathology favors squamous cell carcinoma Remains on 3 L [place] with saturation of 94% Denies any acute complaints today, feeling relatively well    SUBJECTIVE/OVERNIGHT/INTERVAL HX 09/19/2017 - > seen at bedside. Son at bedside. Son giving patient/dad a shave. He is on [place] o2. Admits to ongoing hemoptysis - small regular amounts. D/w IR yesterday - very low prob of success in cntrolling hemoptysis via IR embolization. S/p 2 XRT - 7/26 and 7/29. Currently 9 LNC   VITAL SIGNS: Temp:  [97.7 F (36.5 C)-100 F (37.8 C)] 100 F (37.8 C) (07/30 0508) Pulse Rate:  [81-104] 104 (07/30 0508) Resp:  [20-22] 20 (07/30 0508) BP: (95-118)/(62-75) 118/63 (07/30 0508) SpO2:  [72 %-100 %] 72 % (07/30 0508) Weight:  [53.9 kg (118 lb 13.3 oz)] 53.9 kg (118 lb 13.3 oz) (07/30 0508)  PHYSICAL EXAMINATION:  General Appearance:    Frail, in bed, pleasant  Head:    Normocephalic, without obvious abnormality, atraumatic  Eyes:    PERRL - yes, conjunctiva/corneas - clea5r      Ears:    Normal external ear canals, both ears  Nose:   NG tube - no but has 9L [place] o2  Throat:  ETT TUBE - no , OG tube - no  Neck:   Supple,  No enlargement/tenderness/nodules  Lungs:     Clear to auscultation bilaterally, No distress  Chest wall:    No deformity  Heart:    S1 and S2 normal, no murmur, CVP - no.  Pressors - no  Abdomen:     Soft, no masses, no organomegaly  Genitalia:    Not done  Rectal:   not done  Extremities:   Extremities- intact     Skin:   Intact in exposed areas       Neurologic:   Sedation - none -> RASS - +1 . Moves all 4s - yes3. CAM-ICU - neg . Orientation - x2-3+      Recent Labs  Lab 09/15/17 0458 09/16/17 0453 09/19/17 0518  NA 136 132* 134*  K 4.6 4.3 4.3  CL 106 103 104  CO2 _0 BUN 16 17 24*  CREATININE 1.06 0.88 0.92  GLUCOSE 76 85 91   Recent Labs  Lab 09/14/17 0521  09/15/17 0458 09/16/17 0453  09/18/17 0431 09/18/17 2027 09/19/17 0518  HGB 7.5*   < > 8.2* 8.5*   < > 7.6* 10.5* 10.2*  HCT 24.3*   < > 26.2* 27.7*   < > 24.5* 33.3* 32.5*  WBC 8.2  --  9.3 8.8  --   --   --   --   PLT 298  --  306 314  --   --   --   --    < > = values in this interval not displayed.   No results found.  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 Per IR low prob for success with embolixation - ECOG 4 - has 9LNC need   Plan  - recommend palliative med control of dyspnea and cough  - son informed that hemotpysi could be mode of death - rest per primary and palliative care  Ccm will sign off    Dr. Brand Males, M.D., [facility].C.P Pulmonary and Critical Care Medicine Staff Physician, [place] Director - Interstitial Lung Disease  Program  Pulmonary [place] at [place], [place], [address]  Pager: [phone], If no answer or between  15:00h - 7:00h: call [phone] Telephone: [phone]

## 2017-09-19 NOTE — Progress Notes (Signed)
Patient called out saying he "cannot breathe". SOB at rest on 4L O2 saturations down to 84%. High flow oxygen initiated @ 8L with PRN duoneb. Saturations returned to 90's. SOB resolved for now. Will continue to monitor.

## 2017-09-19 NOTE — Progress Notes (Signed)
Report given to Marcelene Butte at [facility], Pt will be transported via [place].

## 2017-09-19 NOTE — Progress Notes (Signed)
RT called to pts. room due to c/o >'d shortness of breath and coughing up some blood, sats low to mid 80's, CCM has been following pt., chart reviewed, given prn DuoNeb aerosol with mask after placing him back on a [place] HFNC filled with S/W currently at 8 lpm with sats 93% after breathing tx., b/l b.s. noted with some expiratory wheezes >'d on L with some crackles on R, tolerated fairly well with some pain meds given by RN.

## 2017-09-19 NOTE — Discharge Summary (Signed)
Physician Discharge Summary  Corey Dorsey FTD:[id number] DOB: 02/19/1941 DOA: 09/05/2017  PCP: Golden Circle, FNP  Admit date: 09/05/2017 Discharge date: 09/19/2017  Time spent: 65 minutes  Recommendations for Outpatient Follow-up:  1. Patient to be discharged to a residential hospice home.  Follow-up with MD at residential hospice home.   Discharge Diagnoses:  Principal Problem:   Non-small cell carcinoma of lung (HCC) Active Problems:   Tobacco use disorder   Lung mass   Shortness of breath   Elevated troponin   Microcytic hypochromic anemia   Hemoptysis   Hypoxia   Cough with hemoptysis   Lung neoplasm   Pneumonia   Discharge Condition: Stable  Diet recommendation: Regular  Filed Weights   09/15/17 0450 09/18/17 0456 09/19/17 0508  Weight: 55.1 kg (121 lb 7.6 oz) 53.4 kg (117 lb 11.6 oz) 53.9 kg (118 lb 13.3 oz)    History of present illness:  Per Dr. Gwynneth Aliment is a 77 y.o. male with no significant past medical history and has not been to a doctor for many years started experiencing chest pain in the left anterior chest wall for the last few weeks.  Chest pain was present even at rest and increased on deep inspiration.  Has been examined productive cough sometimes blood-tinged mostly in the mornings.  Has been doing some weight loss last few weeks.  Has benign poor appetite.  ED Course: In the ER CT chest showed lung mass with possible metastasis concerning for bronchogenic carcinoma.  Troponin was mildly elevated and EKG shows normal sinus rhythm.  ER physician discussed with on-call oncologist who advised patient will need biopsy and pulmonary consult.     Hospital Course:  1 fever secondary to postobstructive pneumonia Patient likely with postobstructive pneumonia as CT chest with lung mass and pulmonary nodules concerning for mets.  Patient had repeat CT angiogram done 09/10/2017 showing worsening opacities.  Patient noted to have a  temperature up to 101, on 09/10/2017.  Patient placed on IV cefepime and completed a 7-day course of treatment.  Blood cultures with no growth to date.    Patient maintained on scheduled nebs.  Patient remained afebrile throughout the rest of the hospitalization.  2.  Hemoptysis Secondary to non-small cell carcinoma noted on bronchial washings.  Patient noted to have hemoptysis overnight and per patient some this morning which is slowly clearing up.   Status post transfusion of 4 units packed red blood cells during this hospitalization. Hemoglobin was 10.2 by day of discharge.  It is noted that patient's hemoptysis has been occurring for several weeks prior to biopsy which was done.  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 IR was consulted and recommended discussion with pulmonary about localizing hemoptysis prior to consideration of called embolization if patient were to have significant worsening hemoptysis.   Patient seen again by pulmonary on 09/12/2017 and patient subsequently underwent bronchoscopy with brushings taken which are consistent with non-small cell carcinoma..  Per pulmonary bronchoscopy showed bleeding from anterior segment of the left upper lobe.  Per pulmonary if patient develops massive hemoptysis could target this area for IR embolization.  Radiation oncology consulted for palliative radiation secondary to hemoptysis.  Patient received radiation treatment on 09/15/2017 and second treatment on 09/18/2017. Per pulmonary, concern for massive hemoptysis related to death and as such pulmonary discussed with IR who felt there was low probability for successful embolization.  Patient seen by palliative care.  Patient noted to have ongoing hemoptysis and patient noted to have a limited prognosis due to ongoing hemoptysis as well as possibility of terminal hemorrhage.  Decision was  made to transition to comfort measures and hospice.  Patient will be discharged to residential hospice home.   3.  Lung mass status post biopsy 09/07/2017/non-small cell carcinoma.  Concerning for bronchogenic carcinoma with diffuse mets.  Patient with ongoing hemoptysis.  Patient underwent bronchoscopy 09/12/2017 with brushings taken and results pending.  Pulmonary following.  Biopsies done by interventional radiology with pathology with necrosis with focal poorly differentiated carcinoma.  Bronchial washings consistent with non-small cell carcinoma.  Patient with metastatic disease.  CT head consistent with mets.  Patient seen by oncology and feel patient is a poor candidate for any chemotherapy and recommending palliative care and palliative radiation for hemoptysis.    Patient was started on Keppra for seizure prophylaxis.  Radiation oncology consulted for palliative radiation for hemoptysis.  Radiation oncology scheduled to treatments 1 09/15/2017 and abdomen 09/18/2017 however no significant improvement with hemoptysis.  Patient seen by palliative care and pulmonary.  Decision was made to transition to hospice.  Patient will be discharged to residential hospice home.   4.  Hypoxia Was called by RN as patient noted to be hypoxic with sats in the 70s to 80s despite 6 L nasal cannula on 09/13/2017.  Patient noted to be hypoxic the night of 09/12/2017, with sats of 79% and patient noted to not have nasal cannula incorrectly.  Nasal cannula placed and patient noted to have sats in the 80s and O2 had to be increased to 5 L with sats of 92% overnight.  On evaluation of patient on 09/13/2017 patient with no significant distress.  O2 sats was previous as patient noted to have clubbing and pulse ox was moved to the earlobe, O2 sats immediately improved to 100%.    Patient received 7 days of IV antibiotics for probable postobstructive pneumonia.  On the evening of 09/18/2017 to the morning of 09/19/2017 patient was noted  to have increased O2 requirements.  Chest x-ray obtained with stable bilateral pulmonary metastatic disease as well as bilateral airspace opacities concerning for pneumonia versus volume overload.  Patient was afebrile.  Patient received 2 units of packed red blood cells on 09/18/2017 and was felt there was a component of volume overload.  Patient given a dose of Lasix 20 mg IV x1.  Patient noted to have a poor prognosis with ongoing hemoptysis.  Patient is being transitioned to hospice and will be discharged to residential hospice home.    5.  Elevated troponin 2D echo with normal EF and grade 1 diastolic dysfunction.  CT chest did show significant calcific disease.  Patient seen in consultation by cardiology who feel patient is not a very good candidate for intervention at this time particularly with his hemoptysis and possibly limited life expectancy.  Follow.  6.  Iron deficiency anemia/anemia of chronic disease/acute blood loss anemia secondary to hemoptysis Patient noted during the hospitalization to be anemic felt secondary to ongoing hemoptysis.  Anemia panel which was done was consistent with iron deficiency anemia.  Patient received a total of 4 units packed red blood cells as well as IV Feraheme during the hospitalization.  Patient noted to have ongoing hemoptysis.  Hemoglobin by day of discharge was 10.2.  7.  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  Patient  with ongoing hemoptysis during the hospitalization and was followed by critical care medicine.  Patient underwent bronchoscopy with results as stated above.  Patient was seen in consultation by critical care medicine, interventional radiology and palliative care medicine, and radiation oncology.  Patient underwent radiation treatment to left upper lobe mass x2 for palliation and control however patient with ongoing hemoptysis.  Pulmonary and critical care medicine discussed with  IR and was noted that patient had a low probability for success with embolization.  Patient also seen by oncology and felt not to be a chemotherapy candidate.  Oncology recommended palliative care.  It was felt and recommended by pulmonary and critical care medicine that patient had palliative medication control for shortness of breath and cough as patient noted to have increased O2 requirements on day of discharge needing 8 L of high flow nasal cannula.  Patient will be discharged to residential hospice home.      Procedures:  CT angiogram chest 09/10/2017, 09/05/2017  CT left shoulder 09/07/2017  CT-guided core biopsy left lung mass per IR Dr. Kathlene Cote 09/07/2017  Chest x-ray 09/05/2017, 09/10/2017  2D echo 09/06/2017  Bronchoscopy per Dr. Lake Bells 09/12/2017  Transfuse 1 unit packed red blood cells 09/10/2017.  Transfuse 1 unit packed red blood cells 09/14/2017  CT head with and without contrast 09/13/2017.  Transfuse 2 units packed red blood cells.  09/18/2017      Consultations:  PCCM Dr. Lynetta Mare 09/06/2017  Cardiology Dr. Harrington Challenger 09/06/2017  Interventional radiology  Palliative care: Dr. Rowe Pavy 09/14/2017  Oncology: Dr. Lorna Few 09/13/2017  Radiation oncology: Dr. Isidore Moos 09/15/2017      Discharge Exam: Vitals:   09/19/17 0508 09/19/17 1046  BP: 118/63 93/67  Pulse: (!) 104 81  Resp: 20   Temp: 100 F (37.8 C)   SpO2: (!) 72% 95%    General: NAD Cardiovascular: RRR Respiratory: Scattered diffuse crackles/coarse breath sounds.  Discharge Instructions   Discharge Instructions    Diet general   Complete by:  As directed    Increase activity slowly   Complete by:  As directed      Allergies as of 09/19/2017   No Known Allergies     Medication List    STOP taking these medications   naproxen sodium 220 MG tablet Commonly known as:  ALEVE     TAKE these medications   acetaminophen 500 MG tablet Commonly known as:  TYLENOL Take 500 mg by  mouth daily as needed for mild pain.   diclofenac sodium 1 % Gel Commonly known as:  VOLTAREN Apply 2 g topically 4 (four) times daily as needed (rib pain).   ferrous sulfate 325 (65 FE) MG tablet Take 1 tablet (325 mg total) by mouth 2 (two) times daily with a meal.   guaiFENesin-dextromethorphan 100-10 MG/5ML syrup Commonly known as:  ROBITUSSIN DM Take 5 mLs by mouth every 4 (four) hours as needed for cough.   ipratropium-albuterol 0.5-2.5 (3) MG/3ML Soln Commonly known as:  DUONEB Take 3 mLs by nebulization every 2 (two) hours as needed.   levETIRAcetam 500 MG tablet Commonly known as:  KEPPRA Take 1 tablet (500 mg total) by mouth 2 (two) times daily.   lidocaine 5 % Commonly known as:  LIDODERM Place 1 patch onto the skin daily. Remove & Discard patch within 12 hours or as directed by MD Start taking on:  09/20/2017   mometasone-formoterol 100-5 MCG/ACT Aero Commonly known as:  DULERA Inhale 2 puffs into the lungs 2 (two) times daily.  morphine CONCENTRATE 10 mg / 0.5 ml concentrated solution Take 0.25-0.5 mLs (5-10 mg total) by mouth every 2 (two) hours as needed for severe pain or shortness of breath.   ondansetron 4 MG tablet Commonly known as:  ZOFRAN Take 1 tablet (4 mg total) by mouth every 6 (six) hours as needed for nausea.   tiotropium 18 MCG inhalation capsule Commonly known as:  SPIRIVA Place 1 capsule (18 mcg total) into inhaler and inhale daily. Start taking on:  09/20/2017      No Known Allergies Follow-up Information    MD AT HOSPICE Follow up.            The results of significant diagnostics from this hospitalization (including imaging, microbiology, ancillary and laboratory) are listed below for reference.    Significant Diagnostic Studies: Dg Chest 2 View  Result Date: 09/05/2017 CLINICAL DATA:  Hemoptysis.  History of smoking. EXAM: CHEST - 2 VIEW COMPARISON:  Prior report 11/09/1998. FINDINGS: Multiple bilateral pulmonary mass lesions  and infiltrates are present. Findings are suspicious for metastatic disease with possible superimposed pneumonia. Small bilateral pleural effusions, left side greater than right. Elevation left hemidiaphragm. No pneumothorax. Heart size normal. No acute bony abnormality. IMPRESSION: Multiple bilateral pulmonary mass lesions infiltrates are present. Findings suspicious for metastatic disease with possible superimposed pneumonia. Small bilateral pleural effusions. Contrast-enhanced chest CT should be considered for further evaluation. Electronically Signed   By: Marcello Moores  Register   On: 09/05/2017 15:03   Ct Head W & Wo Contrast  Result Date: 09/13/2017 CLINICAL DATA:  Lung cancer staging. EXAM: CT HEAD WITHOUT AND WITH CONTRAST TECHNIQUE: Contiguous axial images were obtained from the base of the skull through the vertex without and with intravenous contrast CONTRAST:  113mL ISOVUE-300 IOPAMIDOL (ISOVUE-300) INJECTION 61% COMPARISON:  MRI of the head November 17, 2011 FINDINGS: BRAIN: 14 x 16 mm RIGHT paramedian parietal mass with intrinsic rim density and superimposed marginal enhancement. Surrounding low-density vasogenic edema. 4 mm enhancing nodule LEFT frontal gray-white matter junction (series 6, image 17). No midline shift or mass effect. No acute large vascular territory infarct. No parenchymal brain volume loss for age. No hydrocephalus. Patchy supratentorial white matter hypodensities compatible with mild chronic small vessel ischemic changes, less than expected for age. VASCULAR: Moderate calcific atherosclerosis of the carotid siphons. SKULL: No skull fracture. No significant scalp soft tissue swelling. SINUSES/ORBITS: Chronic LEFT maxillary sinusitis. Minimal RIGHT mastoid effusion.The included ocular globes and orbital contents are non-suspicious. OTHER: None. IMPRESSION: 1. 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 MRI of the brain with contrast may demonstrate additional metastasis. 2.  No acute intracranial process. Electronically Signed   By: Elon Alas M.D.   On: 09/13/2017 19:45   Ct Angio Chest Pe W Or Wo Contrast  Result Date: 09/10/2017 CLINICAL DATA:  Hemoptysis.  Multiple pulmonary tumors. EXAM: CT ANGIOGRAPHY CHEST WITH CONTRAST TECHNIQUE: Multidetector CT imaging of the chest was performed using the standard protocol during bolus administration of intravenous contrast. Multiplanar CT image reconstructions and MIPs were obtained to evaluate the vascular anatomy. CONTRAST:  150mL ISOVUE-370 IOPAMIDOL (ISOVUE-370) INJECTION 76% COMPARISON:  CT-guided biopsy 09/07/2017 Chest CTA 09/05/2017 FINDINGS: Cardiovascular: --Pulmonary arteries: Contrast injection is sufficient to demonstrate satisfactory opacification of the pulmonary arteries to the segmental level. There is no pulmonary embolus. The main pulmonary artery is within normal limits for size. --Aorta: Satisfactory opacification of the thoracic aorta. No aortic dissection or other acute aortic syndrome. Conventional 3 vessel aortic branching pattern. The aortic  course and caliber are normal. There is mild aortic atherosclerosis. --Heart: Normal size. No pericardial effusion. Mediastinum/Nodes: Level 4R nodes measure up to 12 mm. Level 6 nodes measure up to 10 mm. Visualized thyroid is normal. Normal course of the esophagus. No axillary lymphadenopathy. Lungs/Pleura: The dominant mass within the left upper lobe measures 10.7 x 8.4 cm, unchanged in size. The mass encases multiple pulmonary arterial branches. There are numerous other pulmonary nodules scattered throughout both lungs. The largest nodule in the right measures 2.9 cm. Small left pleural effusion. Multiple areas of reticular opacity within both lungs has worsened, most notably in the right upper and middle lobes and the left upper lobe. The lingual bronchus is severely narrowed by the dominant mass, but unchanged from the prior study. Upper Abdomen: Contrast bolus  timing is not optimized for evaluation of the abdominal organs. Within this limitation, the visualized organs of the upper abdomen are normal. Musculoskeletal: No chest wall abnormality. No acute or significant osseous findings. Review of the MIP images confirms the above findings. IMPRESSION: 1. No pulmonary embolus. 2. Unchanged size of dominant left upper lobe mass measuring up to 10.7 cm, narrowing the lingular bronchus and encasing multiple small pulmonary artery branches. This is a probable source of hemoptysis. 3. Numerous bilateral pulmonary metastatic nodules, which are also potential sources of hemoptysis. 4.  Aortic Atherosclerosis (ICD10-I70.0). Electronically Signed   By: Ulyses Jarred M.D.   On: 09/10/2017 17:04   Ct Angio Chest Pe W And/or Wo Contrast  Result Date: 09/05/2017 CLINICAL DATA:  Intermittent chest pain for 2 months. Hemoptysis. Decreased appetite. History of smoking. EXAM: CT ANGIOGRAPHY CHEST WITH CONTRAST TECHNIQUE: Multidetector CT imaging of the chest was performed using the standard protocol during bolus administration of intravenous contrast. Multiplanar CT image reconstructions and MIPs were obtained to evaluate the vascular anatomy. CONTRAST:  187mL ISOVUE-370 IOPAMIDOL (ISOVUE-370) INJECTION 76% COMPARISON:  Chest radiograph 09/05/2017 FINDINGS: Cardiovascular: Pulmonary arterial opacification is adequate without evidence of emboli. Mild thoracic aortic atherosclerosis is noted without aneurysm. The heart is normal in size. Coronary artery atherosclerosis is noted. There is a trace pericardial effusion. Mediastinum/Nodes: Bilateral gynecomastia. Scattered subcentimeter mediastinal lymph nodes, the largest measuring 9 mm in short axis in the AP window. Enlarged hilar lymph nodes measure up to 14 mm on the left. Lungs/Pleura: A large mass in the lingula measures 10.6 x 8.6 x 9.2 cm (AP x transverse x craniocaudal) and extends to both the lateral and mediastinal pleural surfaces  with some adjacent pleural thickening. A trace left pleural effusion versus pleural thickening is noted posteriorly in the left lower hemithorax. There are innumerable pulmonary nodules throughout both lungs. An anterior right upper lobe mass extending to the minor fissure measures 3.9 x 2.6 cm. Moderate centrilobular and paraseptal emphysema is noted. Areas of ground-glass opacity and interlobular septal thickening are present bilaterally, greatest in the left upper lobe and left lower lobe. Upper Abdomen: Suspected stone in the gallbladder, incompletely imaged. Musculoskeletal: No suspicious lytic or blastic osseous lesion. Mild anterior wedging of the T12 vertebral body, most likely chronic. Review of the MIP images confirms the above findings. IMPRESSION: 1. No evidence of pulmonary emboli. 2. 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 Dominant 10 cm lingular mass may reflect primary bronchogenic carcinoma. 3. Mildly enlarged hilar lymph nodes, concerning for nodal metastases. 4. Aortic Atherosclerosis (ICD10-I70.0) and Emphysema (ICD10-J43.9). Electronically Signed   By: Logan Bores M.D.   On: 09/05/2017 16:08   Ct Shoulder Left Wo Contrast  Result Date: 09/07/2017 CLINICAL DATA:  Left shoulder pain EXAM: CT OF THE UPPER LEFT EXTREMITY WITHOUT CONTRAST TECHNIQUE: Multidetector CT imaging of the upper left extremity was performed according to the standard protocol. COMPARISON:  09/05/2017 CT chest FINDINGS: Bones/Joint/Cartilage Flattening of the posterolateral humeral head which could be due to Hill-Sachs impaction. Degenerative glenohumeral spurring with some chronic fragmentation along the posterosuperior glenoid. Chondrocalcinosis of the glenoid labrum. Subacromial morphology is type 2 (curved). Ligaments Suboptimally assessed by CT. Muscles and Tendons Chronic appearing atrophy of the supraspinatus and infraspinatus muscles favoring chronic rotator cuff ruptures. Soft tissues No  obvious mass lesions along visualized segment of the brachial plexus. Extensive tumor burden the left chest. Severe emphysema. No obvious regional bony metastatic lesions; the ribs are blurred by motion artifact. Scattered airspace opacities especially in the left upper lobe. IMPRESSION: 1. 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 2. Flattening of the posterolateral humeral head which may be from remote Hill-Sachs impaction. 3. Extensive tumor burden in the left chest with severe emphysema. 4. Airspace opacity in the left upper lobe, potentially from pneumonia. 5. 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 Mild degenerative glenohumeral spurring. Electronically Signed   By: Van Clines M.D.   On: 09/07/2017 18:25   Ct Biopsy  Result Date: 09/07/2017 CLINICAL DATA:  Large left upper lobe/lingular lung mass with multiple additional bilateral pulmonary masses. EXAM: CT GUIDED CORE BIOPSY OF LEFT LUNG MASS ANESTHESIA/SEDATION: 1.5 mg IV Versed; 50 mcg IV Fentanyl Total Moderate Sedation Time:  10 minutes. The patient's level of consciousness and physiologic status were continuously monitored during the procedure by Radiology nursing. PROCEDURE: The procedure risks, benefits, and alternatives were explained to the patient. Questions regarding the procedure were encouraged and answered. The patient understands and consents to the procedure. A time-out was performed prior to initiating the procedure. CT was performed through the chest in a supine position. The left anterior chest wall was prepped with chlorhexidine in a sterile fashion, and a sterile drape was applied covering the operative field. A sterile gown and sterile gloves were used for the procedure. Local anesthesia was provided with 1% Lidocaine. A 17 gauge trocar needle was advanced into a left lung mass under CT guidance. Three  separate coaxial 18 gauge core biopsy samples were obtained and submitted in formalin. Additional CT images were performed. COMPLICATIONS: None FINDINGS: Large left-sided lung mass centered in the lower left upper lobe and extending into the lingula measures approximately 10.8 cm in maximum diameter. Solid tissue was obtained. IMPRESSION: CT-guided biopsy performed of the large left-sided lung mass measuring nearly 11 cm in diameter. Electronically Signed   By: Aletta Edouard M.D.   On: 09/07/2017 17:22   Dg Chest Port 1 View  Result Date: 09/19/2017 CLINICAL DATA:  Shortness of breath. EXAM: PORTABLE CHEST 1 VIEW COMPARISON:  Radiograph of September 15, 2017.  CT scan of September 10, 2017. FINDINGS: Stable cardiomediastinal silhouette. No pneumothorax is noted. Stable bilateral pulmonary nodules are noted consistent with metastatic disease. This includes dominant mass seen in left lung. Stable bilateral airspace opacity is noted concerning for pneumonia. Probable scarring is seen in left lung base. Bony thorax is unremarkable. IMPRESSION: Stable bilateral pulmonary metastatic disease is noted as well as bilateral airspace opacities concerning for possible pneumonia. Electronically Signed   By: Marijo Conception, M.D.   On: 09/19/2017 10:53   Dg Chest Port 1 View  Result Date: 09/15/2017 CLINICAL  DATA:  Follow-up pneumonia EXAM: PORTABLE CHEST 1 VIEW COMPARISON:  09/10/2017 FINDINGS: Cardiac shadow is stable. Persistent bilateral infiltrates are identified left greater than right. Given some patient positioning differences the overall appearance is stable. No bony abnormality is seen. IMPRESSION: Patchy bilateral infiltrates left greater than right. Electronically Signed   By: Inez Catalina M.D.   On: 09/15/2017 07:01   Dg Chest Port 1 View  Result Date: 09/10/2017 CLINICAL DATA:  Continued small amount of hemoptysis. Status post left lung mass biopsy on 09/07/2017. EXAM: PORTABLE CHEST 1 VIEW COMPARISON:   09/05/2017, chest CTA dated 09/05/2017 and CT guided left lung biopsy dated 09/07/2017. FINDINGS: The previously demonstrated large left lung mass appears larger and more confluent than on 09/05/2017. Multiple smaller masses are again demonstrated elsewhere in both lungs. Interval patchy densities in both lungs. This appears predominantly interstitial with some airspace component. No pneumothorax. Mild scoliosis. Diffuse osteopenia. Normal sized heart. IMPRESSION: 1. Interval increase in size and density of the previously demonstrated large left lung mass. 2. Multiple smaller bilateral lung masses compatible with metastases are again demonstrated. 3. Interval patchy opacities in both lungs. Differential considerations include pneumonia, pneumonitis, pulmonary edema, lymphangitic spread of tumor and hemorrhage. Electronically Signed   By: Claudie Revering M.D.   On: 09/10/2017 09:15    Microbiology: Recent Results (from the past 240 hour(s))  Culture, blood (routine x 2)     Status: None   Collection Time: 09/11/17  8:38 AM  Result Value Ref Range Status   Specimen Description   Final    BLOOD LEFT ANTECUBITAL Performed at [facility], [place] [address] [place], [place] [address]    Special Requests   Final    BOTTLES DRAWN AEROBIC AND ANAEROBIC Blood Culture results may not be optimal due to an excessive volume of blood received in culture bottles Performed at [facility] [address] [place], [place] [address]    Culture   Final    NO GROWTH 5 DAYS Performed at [facility], [place] [address] [place], [place] [address]    Report Status 09/16/2017 FINAL  Final  Culture, blood (routine x 2)     Status: None   Collection Time: 09/11/17  8:48 AM  Result Value Ref Range Status   Specimen Description   Final    BLOOD RIGHT HAND Performed at [facility] [address], [place], [place] [address]    Special Requests   Final    BOTTLES DRAWN  AEROBIC AND ANAEROBIC Blood Culture adequate volume Performed at [facility] [address], [place], [place] [address]    Culture   Final    NO GROWTH 5 DAYS Performed at [facility], [place] [address], [place],  [address]    Report Status 09/16/2017 FINAL  Final     Labs: Basic Metabolic Panel: Recent Labs  Lab 09/13/17 0528 09/14/17 0521 09/15/17 0458 09/16/17 0453 09/19/17 0518  NA 134* 134* 136 132* 134*  K 4.6 4.4 4.6 4.3 4.3  CL 103 103 106 103 104  CO2 24 23 25 24 22   GLUCOSE 85 109* 76 85 91  BUN 19 17 16 17  24*  CREATININE 1.07 1.02 1.06 0.88 0.92  CALCIUM 8.2* 8.3* 8.5* 8.2* 8.4*  MG  --  2.1  --   --   --    Liver Function Tests: No results for input(s): AST, ALT, ALKPHOS, BILITOT, PROT, ALBUMIN in the  last 168 hours. No results for input(s): LIPASE, AMYLASE in the last 168 hours. No results for input(s): AMMONIA in the last 168 hours. CBC: Recent Labs  Lab 09/13/17 0528  09/14/17 0521  09/15/17 0458 09/16/17 0453 09/17/17 0405 09/18/17 0431 09/18/17 2027 09/19/17 0518  WBC 8.3  --  8.2  --  9.3 8.8  --   --   --   --   NEUTROABS  --   --  6.6  --   --   --   --   --   --   --   HGB 7.5*   < > 7.5*   < > 8.2* 8.5* 8.0* 7.6* 10.5* 10.2*  HCT 24.4*   < > 24.3*   < > 26.2* 27.7* 25.8* 24.5* 33.3* 32.5*  MCV 80.0  --  80.5  --  81.9 81.7  --   --   --   --   PLT 315  --  298  --  306 314  --   --   --   --    < > = values in this interval not displayed.   Cardiac Enzymes: No results for input(s): CKTOTAL, CKMB, CKMBINDEX, TROPONINI in the last 168 hours. BNP: BNP (last 3 results) No results for input(s): BNP in the last 8760 hours.  ProBNP (last 3 results) No results for input(s): PROBNP in the last 8760 hours.  CBG: No results for input(s): GLUCAP in the last 168 hours.     Signed:  Irine Seal MD.  [facility] 09/19/2017, 2:13 PM

## 2017-09-19 NOTE — Consult Note (Signed)
[facility]: Met with pt's daughter and the pt. He is showing more decline today than Friday. He continues to cough up bright red blood. He is mor SOB and now needing 9 liters of oxygen continuously. Daughter is on board with hospice philosophy and comfort care. Spoke to the [employer] who did reach out to MD for discharge order. He is in agreement as well. Spoke to our Market researcher and he was approved for [facility]. Daughter signed consents and the pt will be discharged.  Thank you for this referral and the opportunity to work with our community. Webb Silversmith RN [phone]

## 2017-09-19 NOTE — Progress Notes (Signed)
Daily Progress Note   Patient Name: Corey Dorsey       Date: 09/19/2017 DOB: 1940-12-17  Age: 77 y.o. MRN#: [id number] Attending Physician: Eugenie Filler, MD Primary Care Physician: Golden Circle, FNP Admit Date: 09/05/2017  Reason for Consultation/Follow-up: Establishing goals of care, Non pain symptom management and Pain control  Subjective: Awake alert  Resting in bed Continues to have hemoptysis I Met with patient, son, and daughter at bedside.  Discussed clinical course as well as plan moving forward.  Family is in agreement with Plan for residential hospice.  Discussed today limited prognosis as he continues to have hemoptysis as well as possibility of terminal hemorrhage.  Family to meet with [facility] liaison today at 12:30.  Length of Stay: 14  Current Medications: Scheduled Meds:  . acetaminophen  500 mg Oral TID  . ferrous sulfate  325 mg Oral Q breakfast  . levETIRAcetam  500 mg Oral BID  . lidocaine  1 patch Transdermal Q24H  . mometasone-formoterol  2 puff Inhalation BID  . tiotropium  18 mcg Inhalation Daily    Continuous Infusions:   PRN Meds: acetaminophen **OR** acetaminophen, diclofenac sodium, guaiFENesin-dextromethorphan, ipratropium-albuterol, morphine injection, ondansetron **OR** ondansetron (ZOFRAN) IV, oxyCODONE, zolpidem  Physical Exam         Awake alert Resting in bed Shallow clear breath sounds Is thin, has muscle wasting S1 S2 No edema Weak frail appearing Abdomen soft not distended Has hemoptysis: spitting up bright red blood  Vital Signs: BP 93/67   Pulse 81   Temp 100 F (37.8 C) (Oral)   Resp 20   Ht _0  (1.727 m)   Wt 53.9 kg (118 lb 13.3 oz)   SpO2 95%   BMI 18.07 kg/m  SpO2: SpO2: 95 % O2 Device: O2 Device:  Nasal Cannula O2 Flow Rate: O2 Flow Rate (L/min): 8 L/min  Intake/output summary:   Intake/Output Summary (Last 24 hours) at 09/19/2017 1312 Last data filed at 09/19/2017 0600 Gross per 24 hour  Intake 876 ml  Output 925 ml  Net -49 ml   LBM: Last BM Date: (pt unsure and none documented recently) Baseline Weight: Weight: 56.5 kg (124 lb 9 oz) Most recent weight: Weight: 53.9 kg (118 lb 13.3 oz)      PPS 30% Palliative Assessment/Data:  Flowsheet Rows     Most Recent Value  Intake Tab  Referral Department  Hospitalist  Unit at Time of Referral  -- [TELEMENTRY/UROLOGY]  Palliative Care Primary Diagnosis  Pulmonary  Date Notified  09/13/17  Palliative Care Type  New Palliative care  Reason for referral  Clarify Goals of Care  Date of Admission  09/05/17  Date first seen by Palliative Care  09/13/17  # of days Palliative referral response time  0 Day(s)  # of days IP prior to Palliative referral  8  Clinical Assessment  Psychosocial & Spiritual Assessment  Palliative Care Outcomes      Patient Active Problem List   Diagnosis Date Noted  . Malignant neoplasm of upper lobe, left bronchus or lung ([place]) 09/18/2017  . Non-small cell carcinoma of lung ([place]) 09/15/2017  . Hypoxia   . Hemoptysis   . Lung mass 09/05/2017  . Shortness of breath 09/05/2017  . Elevated troponin 09/05/2017  . Microcytic hypochromic anemia 09/05/2017  . Mass of abdomen 11/15/2016  . Elevated LDL cholesterol level 11/15/2016  . Dental abscess 05/09/2015  . Medicare annual wellness visit, subsequent 03/31/2015  . Tobacco use disorder 03/31/2015  . 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   Recommendations/Plan:  1. Topicals such as lidocaine patch and Voltaren gel on rib cage. Also has opioids prn.  2. Added on IV opioid for  use as needed for SOB.  With increased SOB and increased O2 requirement, CXR pending.   D/w Dr. Grandville Silos.  ? If may need diuresis due to volume overload from recent transfusion.   3. Family hopeful for residential hospice for end of life care.  Likely residential hospice today vs. Tomorrow.  Discussed with patient, son, and daughter [name]) who are at the bedside.  D/w Dr Grandville Silos and hospice liaison.     Code Status:    Code Status Orders  (From admission, onward)        Start     Ordered   09/14/17 1249  Do not attempt resuscitation (DNR)  Continuous    Question Answer Comment  In the event of cardiac or respiratory ARREST Do not call a "code blue"   In the event of cardiac or respiratory ARREST Do not perform Intubation, CPR, defibrillation or ACLS   In the event of cardiac or respiratory ARREST Use medication by any route, position, wound care, and other measures to relive pain and suffering. May use oxygen, suction and manual treatment of airway obstruction as needed for comfort.      09/14/17 1249    Code Status History    Date Active Date Inactive Code Status Order ID Comments User Context   09/05/2017 2259 09/14/2017 1249 Full Code [id number]  Rise Patience, MD Inpatient       Prognosis:   < 2 weeks.  He continues to have hemoptysis this AM.  He has required multiple transfusions for this, and with plan for comfort, his expected prognosis is < 2 weeks.  He is also at very high risk for acute decompensation (terminal hemorrhage- see Dr. Chase Caller notes as well) and would be best served by transition to residential hospice facility.  Discharge Planning:  Hospice facility  Care plan was discussed with  Patient, daughter, and son.   Thank you for allowing the  Palliative Medicine Team to assist in the care of this patient.   Time In: 1000 Time Out: 1040 Total Time 40 Prolonged Time Billed  no       Greater than 50%  of this time was spent counseling and  coordinating care related to the above assessment and plan.  Micheline Rough, MD [place] Team [phone]  Please contact Palliative Medicine Team phone at [phone] for questions and concerns.

## 2017-10-06 ENCOUNTER — Encounter: Payer: Self-pay | Admitting: Radiation Oncology

## 2017-10-06 NOTE — Progress Notes (Signed)
  Radiation Oncology         [phone] ________________________________  Name: Corey Dorsey MRN: [id number]  Date: 10/06/2017  DOB: 07/10/1940  End of Treatment Note  Diagnosis:  Poorly differentiated carcinoma of the lung, Stage IV   Indication for treatment:  Palliative       Radiation treatment dates:   09/15/2017 and 09/18/2017  Site/dose:   1. Chest, 6 Gy x 2 fractions for a total dose of 12 Gy  Beams/energy:   1. 3D, 10X // 15X // 6X  Narrative: The patient tolerated radiation treatment relatively well. Patient noted improvement with hemoptysis following first fraction.   Plan: The patient has completed radiation treatment. The patient is transitioning to hospice.    -----------------------------------  Eppie Gibson, MD  This document serves as a record of services personally performed by Eppie Gibson, MD. It was created on her behalf by Steva Colder, a trained medical scribe. The creation of this record is based on the scribe's personal observations and the provider's statements to them. This document has been checked and approved by the attending provider.

## 2017-10-22 DEATH — deceased

## 2018-08-02 IMAGING — DX DG CHEST 1V PORT
1 series · 1 of 1 positions shown · non-contrast
Comparison: Radiograph September 15, 2017.  CT scan September 10, 2017.

CLINICAL DATA: Shortness of breath.

EXAM:
PORTABLE CHEST 1 VIEW

[chest ap]
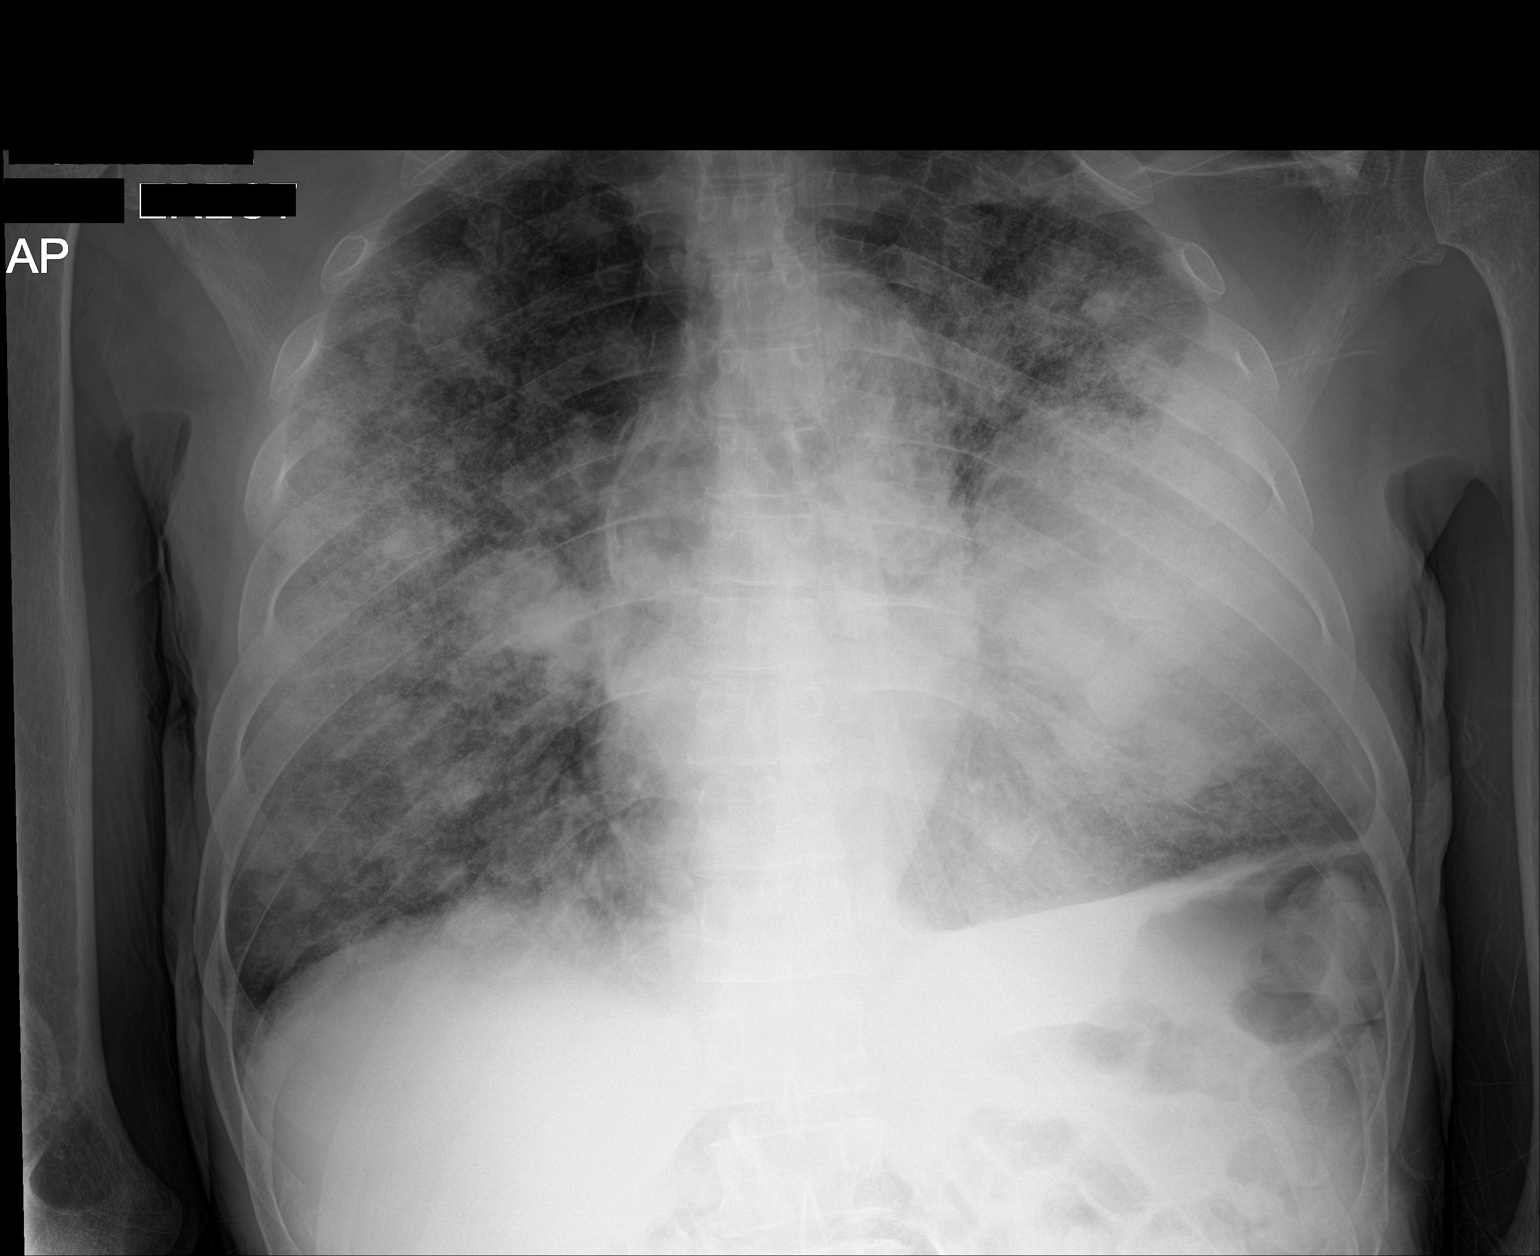

[1 of 1 positions shown; findings below may reference images not displayed]

FINDINGS: Stable cardiomediastinal silhouette. No pneumothorax is noted.
Stable bilateral pulmonary nodules are noted consistent with
metastatic disease. This includes dominant mass seen in left lung.
Stable bilateral airspace opacity is noted concerning for pneumonia.
Probable scarring is seen in left lung base. Bony thorax is
unremarkable.
IMPRESSION: Stable bilateral pulmonary metastatic disease is noted as well as
bilateral airspace opacities concerning for possible pneumonia.
